# Patient Record
Sex: Female | Born: 1980 | Race: White | Hispanic: No | Marital: Married | Smoking: Never smoker
Health system: Southern US, Community
[De-identification: ages and names within clinical notes are randomized; demographics above are authoritative.]

## PROBLEM LIST (undated history)

## (undated) ENCOUNTER — Inpatient Hospital Stay (HOSPITAL_COMMUNITY): Payer: Self-pay

## (undated) DIAGNOSIS — Z9882 Breast implant status: Secondary | ICD-10-CM

## (undated) DIAGNOSIS — L811 Chloasma: Secondary | ICD-10-CM

## (undated) DIAGNOSIS — T4145XA Adverse effect of unspecified anesthetic, initial encounter: Secondary | ICD-10-CM

## (undated) DIAGNOSIS — N979 Female infertility, unspecified: Secondary | ICD-10-CM

## (undated) DIAGNOSIS — T8859XA Other complications of anesthesia, initial encounter: Secondary | ICD-10-CM

## (undated) HISTORY — DX: Chloasma: L81.1

## (undated) HISTORY — PX: BREAST SURGERY: SHX581

---

## 2004-09-06 ENCOUNTER — Other Ambulatory Visit: Admission: RE | Admit: 2004-09-06 | Discharge: 2004-09-06 | Payer: Self-pay | Admitting: Obstetrics and Gynecology

## 2005-09-12 ENCOUNTER — Other Ambulatory Visit: Admission: RE | Admit: 2005-09-12 | Discharge: 2005-09-12 | Payer: Self-pay | Admitting: Obstetrics and Gynecology

## 2006-10-17 ENCOUNTER — Other Ambulatory Visit: Admission: RE | Admit: 2006-10-17 | Discharge: 2006-10-17 | Payer: Self-pay | Admitting: Obstetrics & Gynecology

## 2006-11-05 DIAGNOSIS — Z9882 Breast implant status: Secondary | ICD-10-CM

## 2006-11-05 HISTORY — DX: Breast implant status: Z98.82

## 2007-11-03 ENCOUNTER — Other Ambulatory Visit: Admission: RE | Admit: 2007-11-03 | Discharge: 2007-11-03 | Payer: Self-pay | Admitting: Obstetrics and Gynecology

## 2008-11-16 ENCOUNTER — Other Ambulatory Visit: Admission: RE | Admit: 2008-11-16 | Discharge: 2008-11-16 | Payer: Self-pay | Admitting: Obstetrics and Gynecology

## 2010-11-05 DIAGNOSIS — L811 Chloasma: Secondary | ICD-10-CM

## 2010-11-05 HISTORY — DX: Chloasma: L81.1

## 2011-12-27 ENCOUNTER — Ambulatory Visit
Admission: RE | Admit: 2011-12-27 | Discharge: 2011-12-27 | Disposition: A | Discharge status: Home / Self Care | Payer: Self-pay | Source: Ambulatory Visit | Attending: Diagnostic Radiology | Admitting: Diagnostic Radiology

## 2011-12-27 ENCOUNTER — Other Ambulatory Visit: Payer: Self-pay | Admitting: Diagnostic Radiology

## 2011-12-27 DIAGNOSIS — S0990XA Unspecified injury of head, initial encounter: Secondary | ICD-10-CM

## 2011-12-27 DIAGNOSIS — R51 Headache: Secondary | ICD-10-CM

## 2013-03-16 ENCOUNTER — Encounter: Payer: Self-pay | Admitting: *Deleted

## 2013-03-17 ENCOUNTER — Encounter: Payer: Self-pay | Admitting: *Deleted

## 2013-03-17 ENCOUNTER — Ambulatory Visit (INDEPENDENT_AMBULATORY_CARE_PROVIDER_SITE_OTHER): Payer: BC Managed Care – PPO | Admitting: Nurse Practitioner

## 2013-03-17 ENCOUNTER — Encounter: Payer: Self-pay | Admitting: Nurse Practitioner

## 2013-03-17 VITALS — BP 108/60 | HR 66 | Resp 12 | Ht 63.5 in | Wt 121.0 lb

## 2013-03-17 DIAGNOSIS — Z0000 Encounter for general adult medical examination without abnormal findings: Secondary | ICD-10-CM

## 2013-03-17 DIAGNOSIS — Z309 Encounter for contraceptive management, unspecified: Secondary | ICD-10-CM

## 2013-03-17 DIAGNOSIS — Z01419 Encounter for gynecological examination (general) (routine) without abnormal findings: Secondary | ICD-10-CM

## 2013-03-17 DIAGNOSIS — IMO0001 Reserved for inherently not codable concepts without codable children: Secondary | ICD-10-CM

## 2013-03-17 LAB — POCT URINALYSIS DIPSTICK
Leukocytes, UA: NEGATIVE
Urobilinogen, UA: NEGATIVE
pH, UA: 6

## 2013-03-17 MED ORDER — DROSPIRENONE-ETHINYL ESTRADIOL 3-0.02 MG PO TABS: 1.0000 | ORAL_TABLET | Freq: Every day | ORAL | Status: DC

## 2013-03-17 NOTE — Patient Instructions (Addendum)

## 2013-03-17 NOTE — Progress Notes (Signed)
32 y.o. G0P0 Single Caucasian Fe here for annual exam.  Engaged to be married 04/18/13. menses last for 4 days, slight cramps.  No PMS symptoms.  Wants to plan a pregnancy right after the honeymoon.  Patient's last menstrual period was 03/02/2013.          Sexually active: yes Same partner 3 years The current method of family planning is OCP (estrogen/progesterone).    Exercising: yes  pilates, zumba and weights Smoker:  no  Health Maintenance: Pap:  02/26/2012  Normal (no history of abnormal pap) MMG:  never TDaP:  Within last 4 years, per pt. (will check records) Gardasil - did not take Labs: pt denied   reports that she has never smoked. She has never used smokeless tobacco. She reports that she drinks about 1.8 ounces of alcohol per week. She reports that she does not use illicit drugs.  Past Medical History  Diagnosis Date  . Melasma 2012    History reviewed. No pertinent past surgical history.  Current Outpatient Prescriptions  Medication Sig Dispense Refill  . drospirenone-ethinyl estradiol (YAZ,GIANVI,LORYNA) 3-0.02 MG tablet Take 1 tablet by mouth daily.      . Multiple Vitamin (MULTIVITAMIN) capsule Take 1 capsule by mouth daily.       No current facility-administered medications for this visit.    History reviewed. No pertinent family history.  ROS:  Pertinent items are noted in HPI.  Otherwise, a comprehensive ROS was negative.  Exam:   BP 108/60  Pulse 66  Resp 12  Ht 5' 3.5" (1.613 m)  Wt 121 lb (54.885 kg)  BMI 21.1 kg/m2  LMP 03/02/2013 Height: 5' 3.5" (161.3 cm)  Ht Readings from Last 3 Encounters:  03/17/13 5' 3.5" (1.613 m)    General appearance: alert, cooperative and appears stated age Head: Normocephalic, without obvious abnormality, atraumatic Neck: no adenopathy, supple, symmetrical, trachea midline and thyroid normal to inspection and palpation Lungs: clear to auscultation bilaterally Breasts: normal appearance, no masses or tenderness,  positive findings: implants bilaterally Heart: regular rate and rhythm Abdomen: soft, non-tender; no masses,  no organomegaly Extremities: extremities normal, atraumatic, no cyanosis or edema Skin: Skin color, texture, turgor normal. No rashes or lesions Lymph nodes: Cervical, supraclavicular, and axillary nodes normal. No abnormal inguinal nodes palpated Neurologic: Grossly normal   Pelvic: External genitalia:  no lesions              Urethra:  normal appearing urethra with no masses, tenderness or lesions              Bartholin's and Skene's: normal                 Vagina: normal appearing vagina with normal color and discharge, no lesions              Cervix: anteverted              Pap taken: no Bimanual Exam:  Uterus:  normal size, contour, position, consistency, mobility, non-tender              Adnexa: no mass, fullness, tenderness               Rectovaginal: Confirms               Anus:  normal sphincter tone, no lesions  A:  Well Woman with normal exam  Contraception with OCP  Wants to plan pregnancy after wedding in June  P:   Pap smear as per guidelines  Return in June after honeymoon for preconceptual planning  Start prenatal multivitamins now  counseled on breast self exam, adequate intake of calcium and vitamin D,   diet and exercise  return annually or prn  An After Visit Summary was printed and given to the patient.

## 2013-03-18 NOTE — Progress Notes (Signed)
Encounter reviewed by Dr. France Lusty Silva.  

## 2013-05-04 ENCOUNTER — Ambulatory Visit: Payer: BC Managed Care – PPO | Admitting: Nurse Practitioner

## 2013-11-20 ENCOUNTER — Telehealth: Payer: Self-pay | Admitting: Nurse Practitioner

## 2013-11-20 NOTE — Telephone Encounter (Signed)
Pt would like to have fertility testing done.

## 2013-11-23 NOTE — Telephone Encounter (Signed)
Patient has been trying to conceive since June 2014. Having regular periods. Appointment with Lauro FranklinPatricia Rolen-Grubb, FNP scheduled for 11/26/13.

## 2013-11-23 NOTE — Telephone Encounter (Signed)
I discussed this with Dr. Edward JollySilva - since she has been trying for almost a year. One of the MD's needs to see her now and evaluate infertility and decide on Clomid, etc.  She should not be on my schedule.

## 2013-11-24 NOTE — Telephone Encounter (Signed)
Spoke with pt about Dr. Rica RecordsSilva's recommendation for her to see an MD about infertility. Pt agreeable. Pt needs appt after 2 pm if possible. Sched OV with SM 11-26-13 at 4:30.

## 2013-11-24 NOTE — Telephone Encounter (Signed)
LMTCB to reschedule appt  

## 2013-11-26 ENCOUNTER — Ambulatory Visit: Payer: BC Managed Care – PPO | Admitting: Nurse Practitioner

## 2013-11-26 ENCOUNTER — Ambulatory Visit (INDEPENDENT_AMBULATORY_CARE_PROVIDER_SITE_OTHER): Payer: BC Managed Care – PPO | Admitting: Obstetrics & Gynecology

## 2013-11-26 VITALS — BP 120/78 | HR 72 | Resp 16 | Ht 63.25 in | Wt 116.2 lb

## 2013-11-26 DIAGNOSIS — Z3169 Encounter for other general counseling and advice on procreation: Secondary | ICD-10-CM

## 2013-11-26 DIAGNOSIS — N915 Oligomenorrhea, unspecified: Secondary | ICD-10-CM

## 2013-11-26 DIAGNOSIS — Z0184 Encounter for antibody response examination: Secondary | ICD-10-CM

## 2013-11-26 MED ORDER — LETROZOLE 2.5 MG PO TABS: ORAL_TABLET | ORAL | Status: DC

## 2013-11-26 NOTE — Progress Notes (Signed)
Patient ID: Tiffany George, female   DOB: Mar 09, 1981, 33 y.o.   MRN: [id number]  32 y.o.  Married Caucasian Female here for preconceptual counseling.  Got married in June.  Was on yaz for years.  Total use for OCPs was about 15 years.  Went off pills, started cycle immediately.  Cycles are between 28-30 with one month 32 days.  OPTs show ovulation consistently 20-21 days.  Flow has been really light but lasts 7-8 days.  Doesn't really have cramping which is the reason she went on pills in the first place.  Gynecological History:   Menarche: LMP:  10/30/13 Length of cycle: 7-8 days Length of Menses: 28-30 days GYN infectious disease history: No abn paps.  No STDs.   PMH: Any history of DM, HTN, epilepsy, Heart Murmur, or thyroid problems? No  Any domestic violence? No Any medications? No     Patient's Past Medical History: Have you ever had surgery related to female organs? No Past pregnancies/ complications/ or miscarriages/ abortions? No ETOH? No Tobacco Use?No Drug use? No  Reviewed Medication list: Yes Current job exposure risk:  No Hot tub/ sauna use? Three times a year, at most (spouse) Do you commonly run long distance or do strenuous exercise? No, pilates and gym 3 times weekly Do you eat a strict vegetarian diet? No  Partners Past Medical History: Have you ever had surgery related to female organs? No Previous Paternity? No Testicular Injury? No ETOH?   No Tobacco use: No Drug use: No Current medications:  No medications Current job exposure risk toxins/ lead/ mercury:  No Hot/ tub sauna use? No Do you commonly run long distance or do strenuous exercise? No  No genetic defects known in either family.  No mental retardation or Down's syndrome.  No twins in either family.   Immunization Updates: Rubella Vaccine/ titer:  Obtained today Chicken Pox / vaccination: Yes Tdap for pt. and partner: Yes, 8/09   Assessment:  Luteal phase defect with ovulation on day 20 or  21  Plan: Continue PNVs  Have spouse do a semen analysis.  "Kit" given.  Order given.  Discussed in great detail how this should be done.  Pt was very confused so instructions written step by step on the order form (which already had instructions on it). Discussion of timing of intercourse in relation to ovulation  Labs: FSH, TSH, Rubella today.  Femara 2.5 mg, 3 tabs days 5-9 of cycle. 1% triplet, 5% twinning rates discussed as well as mood swings, hot flashes, and ovarian cyst formation.  Day 23 progesterone.  Pt to call with onset of cycle for lab order/date.  ~50 minutes spent with patient >50% of time was in face to face discussion of above.

## 2013-11-26 NOTE — Patient Instructions (Signed)
Start with next cycle Femara 2.$RemoveBefore'5mg'[id number]$ .  You will take 3 tablets (7.$RemoveBefo'5mg'qIxCVCoTTjH$ ) days 5-9.  First day of cycle is first real day of bleeding. Take Femara around same time every day.   You can continue the Ovulation predictor kit.  Should ovulate around day 15.   Test progesterone level on day 23 cycle.  That lab will be done either in out office or at a [facility] lab.   Call the office when you start your cycle, so I know when you start the Femara.  Do Semen analysis.

## 2013-11-27 ENCOUNTER — Encounter: Payer: Self-pay | Admitting: Obstetrics & Gynecology

## 2013-11-27 LAB — TSH: TSH: 2.08 u[IU]/mL (ref 0.350–4.500)

## 2013-11-28 LAB — FOLLICLE STIMULATING HORMONE: FSH: 2.2 m[IU]/mL

## 2013-11-28 LAB — RUBELLA SCREEN: Rubella: 1.8 Index — ABNORMAL HIGH (ref <0.90)

## 2013-12-02 ENCOUNTER — Telehealth: Payer: Self-pay | Admitting: Obstetrics & Gynecology

## 2013-12-02 DIAGNOSIS — N915 Oligomenorrhea, unspecified: Secondary | ICD-10-CM

## 2013-12-02 NOTE — Telephone Encounter (Signed)
Just signed off on labs.  Her FSH is fine as is rubella and TSH.

## 2013-12-02 NOTE — Telephone Encounter (Signed)
Dr. Hyacinth MeekerMiller, no result note yet, can you review? I just wanted confirm a message from you prior as I didn't want to interpret [facility].

## 2013-12-02 NOTE — Telephone Encounter (Signed)
Message copied by Joeseph AmorFAST, Mildreth Reek L on Wed Dec 02, 2013  4:42 PM ------      Message from: Jerene BearsMILLER, MARY S      Created: Wed Dec 02, 2013  3:53 PM       Inform TSH, FSH normal.  Rubella is high which means she has antibodies and doesn't need repeat vaccination.  Thank you. ------

## 2013-12-02 NOTE — Telephone Encounter (Signed)
Spoke with patient and message from Dr. Hyacinth MeekerMiller given-verbalized understanding.   Patient states she started her cycle on 1/26, states she will start Femara on 1/30 and will need 23 day progesterone on 2/17. Lab appointment scheduled and patient aware. Progesterone ordered. Dr. Hyacinth MeekerMiller is progesterone order okay or does it need to be the 17-Hydroxyprogesterone?

## 2013-12-02 NOTE — Telephone Encounter (Signed)
Pt also wants to talk to the nurse about her results

## 2013-12-03 NOTE — Telephone Encounter (Signed)
Progesterone only is correct.  Not 17-hydroxyprogesterone.  Encounter closed.

## 2013-12-04 ENCOUNTER — Telehealth: Payer: Self-pay

## 2013-12-04 NOTE — Telephone Encounter (Signed)
Patient is retuning a call to kelly okay to leave vm

## 2013-12-04 NOTE — Telephone Encounter (Signed)
Message copied by Elisha HeadlandNIX, Ramonita Koenig S on Fri Dec 04, 2013  9:09 AM ------      Message from: Jerene BearsMILLER, MARY S      Created: Wed Dec 02, 2013  3:53 PM       Inform TSH, FSH normal.  Rubella is high which means she has antibodies and doesn't need repeat vaccination.  Thank you. ------

## 2013-12-04 NOTE — Telephone Encounter (Signed)
lmtcb

## 2013-12-04 NOTE — Telephone Encounter (Signed)
Patient notified of results. See result note.//kn 

## 2013-12-08 ENCOUNTER — Telehealth: Payer: Self-pay | Admitting: Obstetrics & Gynecology

## 2013-12-08 DIAGNOSIS — IMO0002 Reserved for concepts with insufficient information to code with codable children: Secondary | ICD-10-CM

## 2013-12-08 NOTE — Telephone Encounter (Signed)
Pt's husband Alycia RossettiRyan is calling to see if his sperm analysis results are in yet.

## 2013-12-08 NOTE — Telephone Encounter (Signed)
Call to patient, notified of husband semen analysis and Dr Rondel BatonMiller's recommendation to refer for IUI.  [facility] while waiting for referral.  She is on cycle day 9 now and just completed Femara. Has progesterone level scheduled for 12-22-13. Will refer to Dr April MansonYalcinkaya and phone number given.  Will schedule [facility] with next cycle if has not yet seen Dr April MansonYalcinkaya.     Anything additional?

## 2013-12-08 NOTE — Telephone Encounter (Signed)
Agree with note and plan.  Encounter closed.

## 2013-12-08 NOTE — Telephone Encounter (Signed)
Dr. Hyacinth MeekerMiller, do you have Sperm testing results?

## 2013-12-09 ENCOUNTER — Telehealth: Payer: Self-pay | Admitting: Obstetrics & Gynecology

## 2013-12-09 NOTE — Telephone Encounter (Addendum)
Return call to patient.  Asking with several questions:  1) Requesting specific percentages of abnormal forms in husband's semen analysis.  Percentages given as per report and that no normal forms were identified.  Advised thaat this test will likely be repeated when she sees Dr April MansonYalcinkaya and that Dr Hyacinth MeekerMiller even thought urology consult might be recommended by [facility].    2) Patient asking if she will have IUI at appointment on 12-23-13.  Advised this is consult only and that there are multiple things done with IUI to maximize potential for pregnancy.  Briefly described steps involved with monitoring ovarian function and timing of procedure, [facility]I will review this in detail at consult.  3) If husband has been sick and on antibiotics for three days prior to semen analysis would this have caused these results.  Advised it may have some impact but will need to check with Dr Hyacinth MeekerMiller for specifics and again explained that this test will likely be repeated.    Please advise.

## 2013-12-09 NOTE — Telephone Encounter (Signed)
Patient has some questions about results. Please call before 3:45

## 2013-12-09 NOTE — Addendum Note (Signed)
Addended by: Joeseph AmorFAST, Mechille Varghese L on: 12/09/2013 02:33 PM   Modules accepted: Orders

## 2013-12-10 NOTE — Telephone Encounter (Signed)
Call to patient and advised of Dr Rondel BatonMiller's response. Patient has received call and appt scheduled with [facility]I for 12-17-13.  She is scheduled for day 23 prog on 12-22-13. Advised to let us know if there is any change in plans once she seens [facility].  Should have level done either here or at their office, just update us after appointment.  Routing to provider for final review. Patient agreeable to disposition. Will close encounter

## 2013-12-10 NOTE — Telephone Encounter (Signed)
Patient returning Sally's call. °

## 2013-12-10 NOTE — Telephone Encounter (Signed)
Call back to patient to advise of Dr Hyacinth MeekerMiller response, LMTCB.

## 2013-12-10 NOTE — Telephone Encounter (Signed)
It is possible that the antibiotics could make that change.  Dr. Celene KrasYalcikaya always repeats the semen analysis when they look like this to be 100% sure.  But if that is the case, IUIs are definitely going to be needed, if not more and they will need to be with specialist.

## 2013-12-21 ENCOUNTER — Other Ambulatory Visit (INDEPENDENT_AMBULATORY_CARE_PROVIDER_SITE_OTHER): Payer: BC Managed Care – PPO

## 2013-12-21 DIAGNOSIS — N915 Oligomenorrhea, unspecified: Secondary | ICD-10-CM

## 2013-12-22 ENCOUNTER — Other Ambulatory Visit: Payer: BC Managed Care – PPO

## 2013-12-22 ENCOUNTER — Telehealth: Payer: Self-pay | Admitting: *Deleted

## 2013-12-22 LAB — PROGESTERONE: Progesterone: 20.5 ng/mL

## 2013-12-22 NOTE — Telephone Encounter (Signed)
Call to patient, VM has name confirmation, LM calling with good results and call back tomorrow.

## 2013-12-22 NOTE — Telephone Encounter (Signed)
Message copied by Alisa GraffYEAKLEY, SARAH on Tue Dec 22, 2013  3:58 PM ------      Message from: Jerene BearsMILLER, MARY S      Created: Tue Dec 22, 2013  3:48 PM       Inform progesterone level indicates she ovulated.  This is good.  Does she have appt yet with specialist.  Husband with abnormal semen analysis. ------

## 2013-12-23 NOTE — Telephone Encounter (Signed)
Spoke with patient and message from Dr. Hyacinth MeekerMiller given. Verbalized understanding.   She states she saw Dr. April MansonYalcinkaya on 2/12. Will start another rx for Femara, do another progesterone test, u/s and then if next will be IUI.   Advised would route that information to Dr. Hyacinth MeekerMiller.   Routing to provider for final review. Patient agreeable to disposition. Will close encounter

## 2014-01-01 ENCOUNTER — Telehealth: Payer: Self-pay | Admitting: *Deleted

## 2014-01-01 ENCOUNTER — Telehealth: Payer: Self-pay | Admitting: Obstetrics & Gynecology

## 2014-01-01 NOTE — Telephone Encounter (Signed)
Patient states she had a missed period this month. LMP 11/30/13 with positive home pregnancy test. Denies complaints. Scheduled office visit with Dr. Hyacinth MeekerMiller for confirmation. Patient agreeable.  Routing to provider for final review. Patient agreeable to disposition. Will close encounter

## 2014-01-01 NOTE — Telephone Encounter (Signed)
Message copied by Alisa GraffYEAKLEY, SARAH on Fri Jan 01, 2014  3:13 PM ------      Message from: Jerene BearsMILLER, MARY S      Created: Tue Dec 22, 2013  3:48 PM       Inform progesterone level indicates she ovulated.  This is good.  Does she have appt yet with specialist.  Husband with abnormal semen analysis. ------

## 2014-01-01 NOTE — Telephone Encounter (Signed)
Advised of lab results.  Positive for ovulation.  Patient reports she now has positive home UPT!  LPM 11-30-13. First cycle on Femara.  No bleeding and no other symptoms.    Had appointment on 12-17-13 with Dr April MansonYalcinkaya and was given another RX for Femara and was told "she may get pregnant on her own".    [facility] spoke with patient earlier and has scheduled OV for Monday 01-04-14 with Dr Hyacinth MeekerMiller.  Anything else?

## 2014-01-01 NOTE — Telephone Encounter (Signed)
Patient says her pregnancy was positive. Patient would like to talk to Dr.Miller's nurse.

## 2014-01-04 ENCOUNTER — Ambulatory Visit (INDEPENDENT_AMBULATORY_CARE_PROVIDER_SITE_OTHER): Payer: BC Managed Care – PPO | Admitting: Obstetrics & Gynecology

## 2014-01-04 VITALS — BP 120/62 | HR 64 | Resp 16 | Ht 63.25 in | Wt 116.0 lb

## 2014-01-04 DIAGNOSIS — Z3201 Encounter for pregnancy test, result positive: Secondary | ICD-10-CM

## 2014-01-04 DIAGNOSIS — N912 Amenorrhea, unspecified: Secondary | ICD-10-CM

## 2014-01-04 LAB — POCT URINE PREGNANCY: PREG TEST UR: POSITIVE

## 2014-01-04 NOTE — Progress Notes (Signed)
Pelvic U/S scheduled and patient aware/agreeable to time on 01/19/14 at 1600.  Patient verbalized understanding of the U/S appointment cancellation policy. Advised will need to cancel within 72 business hours (3 business days) or will have $100.00 no show fee placed to account.

## 2014-01-04 NOTE — Progress Notes (Signed)
33 yo G1P0 MWF here for confirmation of pregnancy.  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  She was started on Femara Femara 2.5mg  days 5-9.  Semen analysis was abnormal and she was referred to Dr. April MansonYalcinkaya who advised she try for several months before proceeding with any additional treatments.  She used Clomid at the beginning of February and missed her menstrual cycle.  LMP 11/30/13.  She has done several home UPTs and does have a +UPT here in the office today.  EGA today is 5 0/7 weeks.  She is having breast tenderness but denies fatigue and nausea at this time.  Advised it is a little early for nausea so this may still occur.  She is taking a PNV at this time.  Her spouse is not with her today.   No cats in the home.  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  She is advised about CMV and need for good handwashing.  Tdap 8/09.  Had chicken pox as a child.  Aware flu vaccine safe and recommended.  Rubella was tested and she is immune.  This was done 11/27/13.    Fish/shellfish/mercury discuss.  Patient does not eat much fish.  Unpasteurized cheese/juices discussed.  Nitrites in foods disucssed.  Exercise and intercourse discussed.  Assessment:  Newly + UPT Plan:  Continue PNV.  Return for viability u/s in two weeks.  Appt will be made.  ~15 minutes spent with patient >50% of time was in face to face discussion of above.

## 2014-01-04 NOTE — Telephone Encounter (Signed)
No.  Ultrasound scheduled.  Encounter closed.

## 2014-01-05 ENCOUNTER — Encounter: Payer: Self-pay | Admitting: Obstetrics & Gynecology

## 2014-01-05 NOTE — Patient Instructions (Signed)
Please call if you have any pain or bleeding before your ultrasound appt.

## 2014-01-18 ENCOUNTER — Telehealth: Payer: Self-pay | Admitting: Obstetrics & Gynecology

## 2014-01-18 NOTE — Telephone Encounter (Signed)
Spoke with pt who is wondering if it is OK to have her nails done, as she has read about fumes not being safe.  Advised that I think it would be OK, but she has PUS tomorrow with Dr. Hyacinth MeekerMiller and can confirm then.

## 2014-01-18 NOTE — Telephone Encounter (Signed)
Patient is calling she has a ultrasound tomorrow to confirm pregnancy and she wants to know if it is okay to go to a nail salon. She is very concerned from what she has read online and wants to talk to a nurse.

## 2014-01-18 NOTE — Telephone Encounter (Signed)
Agree.  Ok to close encounter.

## 2014-01-19 ENCOUNTER — Ambulatory Visit (INDEPENDENT_AMBULATORY_CARE_PROVIDER_SITE_OTHER): Payer: BC Managed Care – PPO

## 2014-01-19 ENCOUNTER — Other Ambulatory Visit: Payer: Self-pay | Admitting: Obstetrics & Gynecology

## 2014-01-19 ENCOUNTER — Ambulatory Visit (INDEPENDENT_AMBULATORY_CARE_PROVIDER_SITE_OTHER): Payer: BC Managed Care – PPO | Admitting: Obstetrics & Gynecology

## 2014-01-19 VITALS — BP 104/68 | Ht 63.25 in | Wt 120.2 lb

## 2014-01-19 DIAGNOSIS — N912 Amenorrhea, unspecified: Secondary | ICD-10-CM

## 2014-01-19 DIAGNOSIS — O3680X0 Pregnancy with inconclusive fetal viability, not applicable or unspecified: Secondary | ICD-10-CM

## 2014-01-19 LAB — US OB TRANSVAGINAL

## 2014-01-19 NOTE — Progress Notes (Signed)
33 y.o. Tiffany KandG1 Marriedfemale here for a pelvic ultrasound for viability.  Pt and spouse have been trying to get pregnant since marriage.  Pt started on Femara and conceived in first month.  Doing well.  Having fatigue and breast tenderness.  No cramping or vaginal bleeding.   Patient's last menstrual period was 11/30/2013.  FINDINGS: UTERUS: Two sacs seen.  One appears normal with normal GS, YS, and fetal pole with +FCA.  Second is small and slightly irregular with no evidence of yolk sac or fetal pole.  Size consistent with dating. ADNEXA:   Left ovary 3.7cm, no evidence of abnormalities   Right ovary 3.7cm, no evidence of abnormalities CUL DE SAC: no free fluid   D/w pt findings including second sac--possible early but nonviable sac.  Advised she will either pass this or will resolve spontaneously.    No cats in the home.  CMV risk low as not around young children.   Tdap 07/05/08.  Had chicken pox.  Aware flu vaccine safe and recommended.  Rubella Immune from testing 1/15.  Fish/shellfish/mercury discuss.  Patient does not eat fish.  Unpasteurized cheese/juices discussed.  Nitrites in foods disucssed.  Exercise and intercourse discussed.  Fetal DNA particle testing discussed.  First trimester down's testing discussed.  Cystic fibrosis discussed.   Assessment:  Viable singleton IUD with smaller, separate 5 week sized sac with no evidence of fetal tissue Plan: Transfer of care.  Reviewed OB offices with pt. Reassured about second sac.  Pt knows this will not result in twin pregnancy.  Very happy.  All questions answered.

## 2014-01-25 ENCOUNTER — Telehealth: Payer: Self-pay | Admitting: Obstetrics & Gynecology

## 2014-01-25 DIAGNOSIS — O209 Hemorrhage in early pregnancy, unspecified: Secondary | ICD-10-CM

## 2014-01-25 NOTE — Telephone Encounter (Signed)
Spoke with pt who is pregnant and had viability PUS last week. [place] She had some bleeding into her underwear, about a half dollar sized amount that did not soak through to her clothes. No pad needed. On Saturday she had some cramps during the day and another episode of bleeding that was a little less than Friday. Overnight on Sat she had no bleeding, but she did notice a weird "clear gel" type substance in her underwear. Nothing happened Sunday or today so far. Pt wondered if it could've been the "extra sac coming out." Please advise.

## 2014-01-25 NOTE — Telephone Encounter (Signed)
Per SM, pt can come tomorrow at lunchtime for a PUS. Slot available at noon. Pt notified and agreeable.

## 2014-01-25 NOTE — Telephone Encounter (Signed)
Spoke with patient. Patient wanting earlier US appointment for tomorrow. Advised she was given the earliest appointment for US. Patient agreeable and verbalizes understanding.  Routing to provider for final review. Patient agreeable to disposition. Will close encounter

## 2014-01-25 NOTE — Telephone Encounter (Signed)
Patient has a question for Dr. Rondel BatonMiller's nurse. No details given. Patient says this is urgent.

## 2014-01-25 NOTE — Telephone Encounter (Signed)
PUS scheduled and order entered.  Routing to provider for final review. Patient agreeable to disposition. Will close encounter

## 2014-01-26 ENCOUNTER — Ambulatory Visit (INDEPENDENT_AMBULATORY_CARE_PROVIDER_SITE_OTHER): Payer: BC Managed Care – PPO | Admitting: Gynecology

## 2014-01-26 ENCOUNTER — Encounter: Payer: Self-pay | Admitting: Gynecology

## 2014-01-26 ENCOUNTER — Other Ambulatory Visit (INDEPENDENT_AMBULATORY_CARE_PROVIDER_SITE_OTHER): Payer: BC Managed Care – PPO

## 2014-01-26 ENCOUNTER — Other Ambulatory Visit: Payer: Self-pay | Admitting: *Deleted

## 2014-01-26 VITALS — BP 104/70 | Ht 63.25 in | Wt 119.0 lb

## 2014-01-26 DIAGNOSIS — O209 Hemorrhage in early pregnancy, unspecified: Secondary | ICD-10-CM

## 2014-01-26 LAB — US OB TRANSVAGINAL

## 2014-01-26 NOTE — Progress Notes (Signed)
      Pt here for f/u viability u/s.  Se conceived with femara and noted to have twins with second pregnancy nonviable.  Pt has single episode of bleeding and cramping but none since.   PUS reviewed today and compared with last week u/s.  SIUP noted, normal appearing yolk sac, and amnion noted.  FHR normal 155.  Second sac without discernable fetus. No evidence of subchorionic hemorrhage. Pt pleased. She was given handout of OB's Questions addressed 6762m spent discussing early pregnancy, >50% face to face

## 2014-01-28 ENCOUNTER — Encounter: Payer: Self-pay | Admitting: Obstetrics & Gynecology

## 2014-02-08 ENCOUNTER — Telehealth: Payer: Self-pay | Admitting: Obstetrics & Gynecology

## 2014-02-08 ENCOUNTER — Ambulatory Visit (INDEPENDENT_AMBULATORY_CARE_PROVIDER_SITE_OTHER): Payer: BC Managed Care – PPO | Admitting: Obstetrics & Gynecology

## 2014-02-08 ENCOUNTER — Encounter: Payer: Self-pay | Admitting: Obstetrics & Gynecology

## 2014-02-08 VITALS — BP 116/64 | HR 68 | Resp 20 | Ht 63.25 in | Wt 120.2 lb

## 2014-02-08 DIAGNOSIS — O209 Hemorrhage in early pregnancy, unspecified: Secondary | ICD-10-CM | POA: Insufficient documentation

## 2014-02-08 NOTE — Telephone Encounter (Signed)
Pt says she was spotting over weekend with her early pregnancy.

## 2014-02-08 NOTE — Progress Notes (Signed)
Subjective:     Patient ID: Tiffany ReadingKelli L Cooperwood, female   DOB: January 31, 1981, 33 y.o.   MRN: [id number]  HPI 33 yo G1 MWF with first trimester bleeding that has occurred now twice after doing Pilates.  Had pink spotting last Thursday.  This was not heavy.  There is no cramping.  Nausea is stable.  No back pain.  Just anxious.  Is seeing Dr. Vincente PoliGrewal later in the week but really doesn't want to wait for that appt to make sure everything is okay  Review of Systems  All other systems reviewed and are negative.       Objective:   Physical Exam  Constitutional: She is oriented to person, place, and time. She appears well-developed and well-nourished.  Abdominal: Soft. Bowel sounds are normal.  Genitourinary: There is no rash or tenderness on the right labia. There is no rash or tenderness on the left labia. No bleeding around the vagina. No vaginal discharge found.  Uterus enlarged at 8-10 weeks sized.  Dark, mucousy blood at cervical os.  Non tender exam.  Neurological: She is alert and oriented to person, place, and time.  Skin: Skin is warm and dry.  Psychiatric: She has a normal mood and affect.       Assessment:     First trimester bleeding     Plan:     Return for PUS

## 2014-02-08 NOTE — Telephone Encounter (Signed)
Patient states that she has intermittent spotting that is brown. She has appointment with an OB on 02/10/14. Not currently seen by them. She is intermittently tearful on the phone. No abdominal pain. Offered office visit with Dr. Hyacinth MeekerMiller, can come now. Patient requested 1030 appointment, scheduled for 1045.   Routing to provider for final review. Patient agreeable to disposition. Will close encounter

## 2014-02-09 ENCOUNTER — Ambulatory Visit (INDEPENDENT_AMBULATORY_CARE_PROVIDER_SITE_OTHER): Payer: BC Managed Care – PPO | Admitting: Obstetrics & Gynecology

## 2014-02-09 ENCOUNTER — Ambulatory Visit (INDEPENDENT_AMBULATORY_CARE_PROVIDER_SITE_OTHER): Payer: BC Managed Care – PPO

## 2014-02-09 ENCOUNTER — Other Ambulatory Visit: Payer: Self-pay | Admitting: Obstetrics & Gynecology

## 2014-02-09 DIAGNOSIS — O209 Hemorrhage in early pregnancy, unspecified: Secondary | ICD-10-CM

## 2014-02-09 DIAGNOSIS — O021 Missed abortion: Secondary | ICD-10-CM

## 2014-02-09 LAB — US OB TRANSVAGINAL

## 2014-02-09 NOTE — Progress Notes (Signed)
33 yo G1 MWF here for repeat viability scan.  Pt has continued to have spotting and has new OB appt this week.  Very anxious about this.  First u/s showed two sacs--one was empty.  I have felt spotting was due to this.  No pain or cramping.  Nausea has improved.  U/S showed CRL c/w 9 1/7 days where fetus should be 10 1/7 days.  No FCA noted.  Pt obviously upset.  No one is with her today.  D/W pt options of conservative management vs suction D&E.  With the first option, risks of bleeding and pain, possible emergency need for intervention, anemia discussed.  With second option, risk fo uterine perforation, possible retained part of pregnancy and need for future procedure, scarring/Ashermann's syndrome, bleeding, pain discussed.  Pt not sure what she wants to due.  Also discussed possible testing for miscarriage but feel that is something she, spouse, and I should discuss together.  All qeustions answered.  She is going to talk with husband and let me know what she wants to do.  Assessment:  Missed abortion  Plan:  Conservative management vs suction D&E discussed  ~20 minutes spent with patient >50% of time was in face to face discussion of above.

## 2014-02-10 ENCOUNTER — Telehealth: Payer: Self-pay | Admitting: Obstetrics & Gynecology

## 2014-02-10 ENCOUNTER — Encounter: Payer: Self-pay | Admitting: Obstetrics & Gynecology

## 2014-02-10 ENCOUNTER — Telehealth: Payer: Self-pay | Admitting: *Deleted

## 2014-02-10 ENCOUNTER — Other Ambulatory Visit: Payer: Self-pay | Admitting: Obstetrics & Gynecology

## 2014-02-10 MED ORDER — DEXTROSE 5 % IV SOLN
2.0000 g | INTRAVENOUS | Status: DC
  Filled 2014-02-10: qty 2

## 2014-02-10 NOTE — Progress Notes (Unsigned)
Very nice 33 yo MWF here for repeat viability scan due to continued first trimester spotting.  Has new OB appt later this week but before transfer just wanted to make sure everything looks normal.   Does have a lot of anxiety about the pregnancy.  U/S showed CRL to be at 9 1/7 week and should be 10 1/7 week.  There is no evidence of FCA.  Discussed this with pt who is obviously upset.   Options for treatment include conservative management with miscarriage on her own.  Bleeding, cramping discussed. Risks of significant bleeding, need to go to ER emergently discussed.  Benefits of natural process and no procedures discussed.  Suction D&E discussed.  Risks of perforation, bleeding, possible future procedures if all tissue isn't removed, pain, transfusion risk all discussed.  Pt and I also discussed risks of this occuring again and possible testing we can do once miscarriage or D&E is complete.  Pt would like to go and disucss with spouse.  Will let me know decision later today or tomorrow.  Offered repeat PUS if needed.  Pt will consider.  Assessment:  Missed abortion  Plan:  Conservative management vs suction D&E  ~20 minutes spent with patient >50% of time was in face to face discussion of above.

## 2014-02-10 NOTE — Telephone Encounter (Signed)
Call to patient to check on status.  Patient states she ha already talked to Dr Hyacinth MeekerMiller and has been scheduled for tomm at 1200. Surgical instructions reviewed.  Post op scheduled for 02-25-14. Call prn  Routing to provider for final review. Patient agreeable to disposition. Will close encounter

## 2014-02-10 NOTE — Telephone Encounter (Signed)
Calling to check on patient and see if she has any questions or has made any decisions.  I did call the OR this morning and pt is scheduled for D&E tomorrow at 12:00.  Pt did not answer.  Left detailed message.  Pt called me right back.  She has decided to proceed with D&E and would like to proceed tomorrow.  Risks of infection, bleeding, rare risk of needing a transfusion, Uterine perforation with possible risks of bowel/bladder/ureteral/vascular injury, retained products with need for future procedure, and risks of scarring/ashermann's syndrome all discussed.  All questions answered.    Pt knows npo with food for 8 hours and with clear liquids for 1 hours before procedure.  Orders entered.

## 2014-02-10 NOTE — Telephone Encounter (Signed)
Spoke with patient about her liability for surgery tomorrow. Patient responsibility $712.47. Patient paid.

## 2014-02-11 ENCOUNTER — Encounter (HOSPITAL_COMMUNITY): Payer: BC Managed Care – PPO | Admitting: Anesthesiology

## 2014-02-11 ENCOUNTER — Ambulatory Visit (HOSPITAL_COMMUNITY)
Admission: RE | Admit: 2014-02-11 | Discharge: 2014-02-11 | Disposition: A | Discharge status: Home / Self Care | Payer: BC Managed Care – PPO | Source: Ambulatory Visit | Attending: Obstetrics & Gynecology | Admitting: Obstetrics & Gynecology

## 2014-02-11 ENCOUNTER — Ambulatory Visit (HOSPITAL_COMMUNITY): Payer: BC Managed Care – PPO | Admitting: Anesthesiology

## 2014-02-11 ENCOUNTER — Encounter (HOSPITAL_COMMUNITY)
Admission: RE | Disposition: A | Discharge status: Home / Self Care | Payer: Self-pay | Source: Ambulatory Visit | Attending: Obstetrics & Gynecology

## 2014-02-11 ENCOUNTER — Encounter (HOSPITAL_COMMUNITY): Payer: Self-pay

## 2014-02-11 DIAGNOSIS — O021 Missed abortion: Secondary | ICD-10-CM

## 2014-02-11 DIAGNOSIS — O209 Hemorrhage in early pregnancy, unspecified: Secondary | ICD-10-CM

## 2014-02-11 DIAGNOSIS — R110 Nausea: Secondary | ICD-10-CM | POA: Insufficient documentation

## 2014-02-11 HISTORY — PX: DILATION AND EVACUATION: SHX1459

## 2014-02-11 HISTORY — DX: Adverse effect of unspecified anesthetic, initial encounter: T41.45XA

## 2014-02-11 HISTORY — DX: Other complications of anesthesia, initial encounter: T88.59XA

## 2014-02-11 LAB — CBC
HEMATOCRIT: 40.1 % (ref 36.0–46.0)
Hemoglobin: 13.7 g/dL (ref 12.0–15.0)
MCH: 31.2 pg (ref 26.0–34.0)
MCHC: 34.2 g/dL (ref 30.0–36.0)
MCV: 91.3 fL (ref 78.0–100.0)
Platelets: 306 10*3/uL (ref 150–400)
RBC: 4.39 MIL/uL (ref 3.87–5.11)
RDW: 13 % (ref 11.5–15.5)
WBC: 8.1 10*3/uL (ref 4.0–10.5)

## 2014-02-11 LAB — ABO/RH: ABO/RH(D): O POS

## 2014-02-11 SURGERY — DILATION AND EVACUATION, UTERUS
Anesthesia: Monitor Anesthesia Care | Site: Uterus

## 2014-02-11 MED ORDER — KETOROLAC TROMETHAMINE 30 MG/ML IJ SOLN
INTRAMUSCULAR | Status: DC | PRN
  Administered 2014-02-11: 30 mg via INTRAVENOUS

## 2014-02-11 MED ORDER — DEXAMETHASONE SODIUM PHOSPHATE 10 MG/ML IJ SOLN
INTRAMUSCULAR | Status: AC
  Filled 2014-02-11: qty 1

## 2014-02-11 MED ORDER — FENTANYL CITRATE 0.05 MG/ML IJ SOLN
INTRAMUSCULAR | Status: DC | PRN
  Administered 2014-02-11: 50 ug via INTRAVENOUS
  Administered 2014-02-11: 100 ug via INTRAVENOUS
  Administered 2014-02-11: 50 ug via INTRAVENOUS

## 2014-02-11 MED ORDER — ONDANSETRON HCL 4 MG/2ML IJ SOLN
INTRAMUSCULAR | Status: AC
  Filled 2014-02-11: qty 2

## 2014-02-11 MED ORDER — PROPOFOL 10 MG/ML IV EMUL
INTRAVENOUS | Status: AC
  Filled 2014-02-11: qty 20

## 2014-02-11 MED ORDER — LACTATED RINGERS IV SOLN
INTRAVENOUS | Status: DC
  Administered 2014-02-11: 11:00:00 via INTRAVENOUS

## 2014-02-11 MED ORDER — PROMETHAZINE HCL 25 MG/ML IJ SOLN
INTRAMUSCULAR | Status: AC
  Filled 2014-02-11: qty 1

## 2014-02-11 MED ORDER — LIDOCAINE-EPINEPHRINE 1 %-1:100000 IJ SOLN
INTRAMUSCULAR | Status: AC
Start: 2014-02-11 — End: 2014-02-11
  Filled 2014-02-11: qty 1

## 2014-02-11 MED ORDER — LIDOCAINE HCL (CARDIAC) 20 MG/ML IV SOLN
INTRAVENOUS | Status: AC
  Filled 2014-02-11: qty 5

## 2014-02-11 MED ORDER — FENTANYL CITRATE 0.05 MG/ML IJ SOLN
INTRAMUSCULAR | Status: AC
  Filled 2014-02-11: qty 2

## 2014-02-11 MED ORDER — ACETAMINOPHEN 325 MG PO TABS
ORAL_TABLET | ORAL | Status: AC
  Filled 2014-02-11: qty 2

## 2014-02-11 MED ORDER — FENTANYL CITRATE 0.05 MG/ML IJ SOLN
25.0000 ug | INTRAMUSCULAR | Status: DC | PRN
  Administered 2014-02-11: 25 ug via INTRAVENOUS

## 2014-02-11 MED ORDER — DEXAMETHASONE SODIUM PHOSPHATE 10 MG/ML IJ SOLN
INTRAMUSCULAR | Status: DC | PRN
  Administered 2014-02-11: 10 mg via INTRAVENOUS

## 2014-02-11 MED ORDER — ACETAMINOPHEN 325 MG PO TABS
650.0000 mg | ORAL_TABLET | Freq: Once | ORAL | Status: AC
  Administered 2014-02-11: 650 mg via ORAL

## 2014-02-11 MED ORDER — DEXTROSE 5 % IV SOLN
1.0000 g | INTRAVENOUS | Status: DC | PRN
  Administered 2014-02-11: 2 g via INTRAVENOUS

## 2014-02-11 MED ORDER — OXYCODONE-ACETAMINOPHEN 5-325 MG PO TABS: 1.0000 | ORAL_TABLET | ORAL | Status: DC | PRN

## 2014-02-11 MED ORDER — PROPOFOL 10 MG/ML IV BOLUS
INTRAVENOUS | Status: DC | PRN
  Administered 2014-02-11 (×2): 30 mg via INTRAVENOUS

## 2014-02-11 MED ORDER — PROMETHAZINE HCL 6.25 MG/5ML PO SYRP: 6.2500 mg | ORAL_SOLUTION | ORAL | Status: DC | PRN

## 2014-02-11 MED ORDER — PROMETHAZINE HCL 25 MG/ML IJ SOLN
6.2500 mg | INTRAMUSCULAR | Status: DC | PRN
  Administered 2014-02-11: 6.25 mg via INTRAVENOUS

## 2014-02-11 MED ORDER — ONDANSETRON HCL 4 MG/2ML IJ SOLN
INTRAMUSCULAR | Status: DC | PRN
  Administered 2014-02-11: 4 mg via INTRAVENOUS

## 2014-02-11 MED ORDER — HYDROCODONE-ACETAMINOPHEN 5-300 MG PO TABS: 1.0000 | ORAL_TABLET | Freq: Four times a day (QID) | ORAL | Status: DC

## 2014-02-11 MED ORDER — MIDAZOLAM HCL 2 MG/2ML IJ SOLN
INTRAMUSCULAR | Status: AC
Start: 2014-02-11 — End: 2014-02-11
  Filled 2014-02-11: qty 2

## 2014-02-11 MED ORDER — MIDAZOLAM HCL 2 MG/2ML IJ SOLN
INTRAMUSCULAR | Status: DC | PRN
  Administered 2014-02-11: 2 mg via INTRAVENOUS

## 2014-02-11 MED ORDER — LIDOCAINE HCL (CARDIAC) 20 MG/ML IV SOLN
INTRAVENOUS | Status: DC | PRN
  Administered 2014-02-11: 60 mg via INTRAVENOUS

## 2014-02-11 MED ORDER — LIDOCAINE-EPINEPHRINE 1 %-1:100000 IJ SOLN
INTRAMUSCULAR | Status: DC | PRN
  Administered 2014-02-11: 10 mL

## 2014-02-11 MED ORDER — PROPOFOL INFUSION 10 MG/ML OPTIME
INTRAVENOUS | Status: DC | PRN
  Administered 2014-02-11: 120 ug/kg/min via INTRAVENOUS

## 2014-02-11 SURGICAL SUPPLY — 21 items
CATH ROBINSON RED A/P 16FR (CATHETERS) ×3 IMPLANT
CLOTH BEACON ORANGE TIMEOUT ST (SAFETY) ×3 IMPLANT
DECANTER SPIKE VIAL GLASS SM (MISCELLANEOUS) ×3 IMPLANT
GLOVE BIOGEL PI IND STRL 7.0 (GLOVE) ×1 IMPLANT
GLOVE BIOGEL PI INDICATOR 7.0 (GLOVE) ×2
GLOVE ECLIPSE 6.5 STRL STRAW (GLOVE) ×6 IMPLANT
GOWN STRL REUS W/TWL LRG LVL3 (GOWN DISPOSABLE) ×6 IMPLANT
KIT BERKELEY 1ST TRIMESTER 3/8 (MISCELLANEOUS) ×3 IMPLANT
NDL SPNL 22GX3.5 QUINCKE BK (NEEDLE) ×1 IMPLANT
NEEDLE SPNL 22GX3.5 QUINCKE BK (NEEDLE) ×3 IMPLANT
NS IRRIG 1000ML POUR BTL (IV SOLUTION) ×3 IMPLANT
PACK VAGINAL MINOR WOMEN LF (CUSTOM PROCEDURE TRAY) ×3 IMPLANT
PAD OB MATERNITY 4.3X12.25 (PERSONAL CARE ITEMS) ×3 IMPLANT
PAD PREP 24X48 CUFFED NSTRL (MISCELLANEOUS) ×3 IMPLANT
SET BERKELEY SUCTION TUBING (SUCTIONS) ×3 IMPLANT
SYR CONTROL 10ML LL (SYRINGE) ×3 IMPLANT
TOWEL OR 17X24 6PK STRL BLUE (TOWEL DISPOSABLE) ×6 IMPLANT
VACURETTE 10 RIGID CVD (CANNULA) IMPLANT
VACURETTE 7MM CVD STRL WRAP (CANNULA) IMPLANT
VACURETTE 8 RIGID CVD (CANNULA) IMPLANT
VACURETTE 9 RIGID CVD (CANNULA) ×2 IMPLANT

## 2014-02-11 NOTE — Anesthesia Postprocedure Evaluation (Signed)
  Anesthesia Post-op Note  Patient: Chauncey ReadingKelli L Savin  Procedure(s) Performed: Procedure(s): DILATATION AND EVACUATION (N/A) Patient is awake and responsive. Pain and nausea are reasonably well controlled. Vital signs are stable and clinically acceptable. Oxygen saturation is clinically acceptable. There are no apparent anesthetic complications at this time. Patient is ready for discharge.

## 2014-02-11 NOTE — Anesthesia Preprocedure Evaluation (Signed)
Anesthesia Evaluation  Patient identified by MRN, date of birth, ID band Patient awake    Reviewed: Allergy & Precautions, H&P , Patient's Chart, lab work & pertinent test results, reviewed documented beta blocker date and time   Airway Mallampati: II TM Distance: >3 FB Neck ROM: full    Dental no notable dental hx.    Pulmonary  breath sounds clear to auscultation  Pulmonary exam normal       Cardiovascular Rhythm:regular Rate:Normal     Neuro/Psych    GI/Hepatic   Endo/Other    Renal/GU      Musculoskeletal   Abdominal   Peds  Hematology   Anesthesia Other Findings   Reproductive/Obstetrics                           Anesthesia Physical Anesthesia Plan  ASA: II  Anesthesia Plan: MAC   Post-op Pain Management:    Induction: Intravenous  Airway Management Planned: LMA, Mask and Natural Airway  Additional Equipment:   Intra-op Plan:   Post-operative Plan:   Informed Consent: I have reviewed the patients History and Physical, chart, labs and discussed the procedure including the risks, benefits and alternatives for the proposed anesthesia with the patient or authorized representative who has indicated his/her understanding and acceptance.   Dental Advisory Given  Plan Discussed with: CRNA and Surgeon  Anesthesia Plan Comments: (Discussed sedation and potential to need to place airway or ETT if warranted by clinical changes intra-operatively. We will start procedure as MAC.)        Anesthesia Quick Evaluation  

## 2014-02-11 NOTE — Discharge Instructions (Signed)
Post-surgical Instructions, Outpatient Surgery  You may expect to feel dizzy, weak, and drowsy for as long as 24 hours after receiving the medicine that made you sleep (anesthetic). For the first 24 hours after your surgery:    Do not drive a car, ride a bicycle, participate in physical activities, or take public transportation until you are done taking narcotic pain medicines or as directed by Dr. Hyacinth MeekerMiller.   Do not drink alcohol or take tranquilizers.   Do not take medicine that has not been prescribed by your physicians.   Do not sign important papers or make important decisions while on narcotic pain medicines.   Have a responsible person with you.   PAIN MANAGEMENT  Motrin 800mg .  (This is the same as 4-200mg  over the counter tablets of Motrin or ibuprofen.)  You may take this every eight hours or as needed for cramping.    Percocet 5/325mg .  For more severe pain, take one or two tablets every four to six hours as needed for pain control.  (Remember that narcotic pain medications increase your risk of constipation.  If this becomes a problem, you may take an over the counter stool softener like Colace 100mg  up to four times a day.)  DO'S AND DON'T'S  Do not take a tub bath for one week.  You may shower on the first day after your surgery  Do not do any heavy lifting for one to two weeks.  This increases the chance of bleeding.  Do move around as you feel able.  Stairs are fine.  You may begin to exercise again as you feel able.  Do not lift any weights for two weeks.  Do not put anything in the vagina for two weeks--no tampons, intercourse, or douching.    REGULAR MEDIATIONS/VITAMINS:  You may restart all of your regular medications as prescribed.  You may restart all of your vitamins as you normally take them.    PLEASE CALL OR SEEK MEDICAL CARE IF:  You have persistent nausea and vomiting.   You have trouble eating or drinking.   You have an oral temperature above  100.5.   You have constipation that is not helped by adjusting diet or increasing fluid intake. Pain medicines are a common cause of constipation.   You have heavy vaginal bleeding  You have redness or drainage from your incision(s) or there is increasing pain or tenderness near or in the surgical site.

## 2014-02-11 NOTE — Addendum Note (Signed)
Addendum created 02/11/14 1456 by Corky Soxhris Yoshito Gaza, MD   Modules edited: Orders

## 2014-02-11 NOTE — Transfer of Care (Signed)
Immediate Anesthesia Transfer of Care Note  Patient: Tiffany George  Procedure(s) Performed: Procedure(s): DILATATION AND EVACUATION (N/A)  Patient Location: PACU  Anesthesia Type:MAC  Level of Consciousness: awake, alert  and oriented  Airway & Oxygen Therapy: Patient Spontanous Breathing and Patient connected to nasal cannula oxygen  Post-op Assessment: Report given to PACU RN and Post -op Vital signs reviewed and stable  Post vital signs: Reviewed and stable  Complications: No apparent anesthesia complications

## 2014-02-11 NOTE — Op Note (Signed)
02/11/2014  1:02 PM  PATIENT:  Tiffany George  33 y.o. female  PRE-OPERATIVE DIAGNOSIS:  Missed Abortion at 10 3/7 wks  POST-OPERATIVE DIAGNOSIS:  Missed Abortion at 10 3/7 weeks  PROCEDURE:  Procedure(s): DILATATION AND EVACUATION  SURGEON:  Annamaria BootsMary Suzanne Braylon Grenda  ASSISTANTS: OR staff   ANESTHESIA:   MAC  ESTIMATED BLOOD LOSS: 100cc  BLOOD ADMINISTERED:none   FLUIDS: 1000ccLR  UOP: 50cc clear  SPECIMEN:  Products of Conception  DISPOSITION OF SPECIMEN:  PATHOLOGY  FINDINGS: Enlarged uterus 10-12 weeks in size.  DESCRIPTION OF OPERATION: Patient was taken to the operating room.  She is placed in the supine position. SCDs were on her lower extremities and functioning properly. General anesthesia with an LMA was administered without difficulty. Dr. Cristela BlueKyle Jackson oversaw case.  Legs were then placed in the [facility]llen stirrups in the low lithotomy position. The legs were lifted to the high lithotomy position and the Betadine prep was used on the inner thighs perineum and vagina x3. Patient was draped in a normal standard fashion. An in and out catheterization with a red rubber Foley catheter was performed. Approximately 50 cc of clear urine was noted. A heavy weighted speculum was placed the vagina. The anterior lip of the cervix was grasped with single-tooth tenaculum.  A paracervical block of 1% lidocaine mixed one-to-one with epinephrine (1:100,000 units).  10 cc was used total. The cervix is dilated up to #[facility]. The endometrial cavity sounded to 10.5 cm.  A #9 curved tip was obtained and passed through the cervix to the fundus of the uterus.  Suction was applied.  With a clock-wise motion, the tip was rotated within the uterine cavity.  Grayish tissue was seen as well as some clear fluid, in addition to blood which was consistent with POCs.  Suction was stopped and tip removed.  This was done a second time.  This time, almost no whitish tissue was seen.  Then using a #2 smooth  curette, the endometrial cavity was curetted until a smooth gritty texture was noted in all quadrants.  A final pass with the suction tip was performed.  Only a small amount of bleeding was noted at this time.    Procedure was ended. The tenaculum was removed from the anterior lip of the cervix. Scan bleeding was noted.  The speculum was removed from the vagina. The prep was cleansed of the patient's skin. The legs are positioned back in the supine position. Sponge, lap, needle, initially counts were correct x2. Patient was taken to recovery in stable condition.  COUNTS:  YES  PLAN OF CARE: Transfer to PACU

## 2014-02-11 NOTE — H&P (Signed)
Tiffany ReadingKelli L George is an 33 y.o. female  G1 MWF here for D&E due to missed abortion.  Pt is 10+ weeks pregnant.  Had ultrasound Tuesday due to spotting and no heart beat was seen.  This was confirmed with Doppler.  Pt and I discussed finidngs.  She was offered conservative management vs surgical intervention. S he has decided to proceed with D&E.  Pt here for this. Risks and benefits have been discussed via phone.  This is documented in prior note.  Patient here with spouse and ready to proceed.  Pertinent Gynecological History: Menses: amenorrhea Bleeding: amnenorrhea Contraception: none DES exposure: denies Blood transfusions: none Sexually transmitted diseases: no past history Previous GYN Procedures: none  Last mammogram: n/a Date: n/a Last pap: normal Date: 2013 OB History: G1, P0   Menstrual History: Patient's last menstrual period was 11/30/2013.    Past Medical History  Diagnosis Date  . Melasma 2012  . Complication of anesthesia     stated was hard to wake up p anesthesia 2008    Past Surgical History  Procedure Laterality Date  . Breast surgery      Augmentation    Family History  Problem Relation Age of Onset  . Cancer - Lung Paternal Grandfather     Social History:  reports that she has never smoked. She has never used smokeless tobacco. She reports that she drinks about 1.8 ounces of alcohol per week. She reports that she does not use illicit drugs.  Allergies: No Known Allergies  Prescriptions prior to admission  Medication Sig Dispense Refill  . acetaminophen (TYLENOL) 325 MG tablet Take 325 mg by mouth every 6 (six) hours as needed for moderate pain.      . Prenatal Vit-Fe Fumarate-FA (PRENATAL MULTIVITAMIN) TABS tablet Take 1 tablet by mouth daily at 12 noon.        Review of Systems  All other systems reviewed and are negative.   Blood pressure 120/79, pulse 90, temperature 98.1 F (36.7 C), temperature source Oral, resp. rate 18, last menstrual period  11/30/2013, SpO2 100.00%. Physical Exam  Vitals reviewed. Constitutional: She is oriented to person, place, and time. She appears well-developed and well-nourished.  Cardiovascular: Normal rate and regular rhythm.   Respiratory: Effort normal and breath sounds normal.  Musculoskeletal: Normal range of motion.  Neurological: She is alert and oriented to person, place, and time.  Skin: Skin is warm and dry.  Psychiatric: She has a normal mood and affect.    Results for orders placed during the hospital encounter of 02/11/14 (from the past 24 hour(s))  CBC     Status: None   Collection Time    02/11/14 10:43 AM      Result Value Ref Range   WBC 8.1  4.0 - 10.5 K/uL   RBC 4.39  3.87 - 5.11 MIL/uL   Hemoglobin 13.7  12.0 - 15.0 g/dL   HCT 28.440.1  13.236.0 - 44.046.0 %   MCV 91.3  78.0 - 100.0 fL   MCH 31.2  26.0 - 34.0 pg   MCHC 34.2  30.0 - 36.0 g/dL   RDW 10.213.0  72.511.5 - 36.615.5 %   Platelets 306  150 - 400 K/uL    [place] Ob Transvaginal  02/09/2014   SEE PROGRESS NOTES FOR RESULTS    Assessment/Plan: 33 yo G1 MWF with missed abortion.  Here for suction D&E.  All questions answered.  Ready to proceed.  Annamaria BootsMary Suzanne Aryannah Mohon 02/11/2014, 11:35 AM

## 2014-02-11 NOTE — Progress Notes (Signed)
Dr Maple HudsonMoser notified of onset of nausea after ambulating and drinking Sprite.  Orders received and phenergan given.

## 2014-02-12 ENCOUNTER — Encounter (HOSPITAL_COMMUNITY): Payer: Self-pay | Admitting: Obstetrics & Gynecology

## 2014-02-16 ENCOUNTER — Telehealth: Payer: Self-pay | Admitting: Obstetrics & Gynecology

## 2014-02-16 NOTE — Telephone Encounter (Signed)
Patient is still having some bad cramping and wanted to know if that normal after the procedure.

## 2014-02-16 NOTE — Telephone Encounter (Signed)
Spoke with patient. She is calling because she is having diarrhea. Today patient has had 3 loose BM with intermittent cramps in pelvic area and above belly button.  Has been having loose stool since Saturday morning after procedure. Unsure if r/t anesthesia or percocet. Took Percocet on Friday night and Sunday night. Now she is taking Motrin (last dose this morning) and Tylenol (last dose a few hours after motrin). She is eating and drinking normally, but feels she has to have a BM directly after eating. Has been using Tums without relief. Denies nausea, denies fevers, denies vaginal bleeding, denies vaginal discharge, denies blood in stool.  Advised patient that I would discuss with Dr. Hyacinth MeekerMiller and return her call with any instructions. Patient agreeable.    1500:  Spoke with Dr. Hyacinth MeekerMiller, to try comfort measures and follow up prn. Try otc immodium as instructions on package.  Increase hydration, water, Gatorade, non caffeinated clear sodas, avoid spicy foods, avoid milk products. Increase rest. To return call to our office if diarrhea continues tomorrow, develops fevers, increased abdominal pain. Patient states she must return to work tomorrow. Advised if continuing with symptoms to call office for office visit for evaluation. Patient verbalized understanding of instructions and symptoms that require follow up. Patient sounds in good spirits.   Routing to provider for final review. Patient agreeable to disposition. Will close encounter

## 2014-02-25 ENCOUNTER — Encounter: Payer: Self-pay | Admitting: Obstetrics & Gynecology

## 2014-02-25 ENCOUNTER — Ambulatory Visit (INDEPENDENT_AMBULATORY_CARE_PROVIDER_SITE_OTHER): Payer: BC Managed Care – PPO | Admitting: Obstetrics & Gynecology

## 2014-02-25 VITALS — BP 124/82 | HR 64 | Resp 16 | Ht 63.25 in | Wt 121.0 lb

## 2014-02-25 DIAGNOSIS — O021 Missed abortion: Secondary | ICD-10-CM

## 2014-02-25 DIAGNOSIS — Z8742 Personal history of other diseases of the female genital tract: Secondary | ICD-10-CM

## 2014-02-25 DIAGNOSIS — Z8759 Personal history of other complications of pregnancy, childbirth and the puerperium: Secondary | ICD-10-CM

## 2014-02-25 NOTE — Progress Notes (Signed)
Post Operative Visit  Procedure:D&E Days Post-op: 15  Subjective: Pt doing well.  Still feeling emotional--which I report to her is completely normal.  She is accompanied by her spouse today.  They are ready to keep trying for pregnancy.  Bleeding and cramping are gone.  Pt had some diarrhea a few days post-op but that is gone.  She is also interested in proceeding with testing for "recurrent miscarriages" although she knows this is early and she has only had one miscarriage.  She really doesn't want to go through this again, if possible.  D/W pt and spouse likelihood of successful pregnancy, even in light of doing nothing at this time with testing.  They still would like to proceed.  UPT is fainty + today.  Pt knows I don't want to do any blood work until this is completely back to normal.    Objective: VSS, reviewed personally  EXAM General: alert and cooperative Resp: clear to auscultation bilaterally Cardio: regular rate and rhythm, S1, S2 normal, no murmur, click, rub or gallop GI: soft, non-tender; bowel sounds normal; no masses,  no organomegaly Gyn:  NAEFG, vaginal normal, no discharge or bleeding, cervix closed, uterus normal size, no CMT Extremities: extremities normal, atraumatic, no cyanosis or edema Vaginal Bleeding: none  Assessment: s/p D&E for missed abortion  Plan: Return one week for UPT.  If negative, proceed with lab work.  Will check for Lupus anticoagulant and antiphospholipid antibodies.  Also will check TSH.  Pt knows to call with onset of cycle to schedule [facility] to assess for intracavitary lesions.  Will do karyotype on pt first.  If normal, spouse wants to do the same too.

## 2014-03-03 ENCOUNTER — Encounter: Payer: Self-pay | Admitting: Obstetrics & Gynecology

## 2014-03-03 ENCOUNTER — Ambulatory Visit (INDEPENDENT_AMBULATORY_CARE_PROVIDER_SITE_OTHER): Payer: BC Managed Care – PPO | Admitting: Gynecology

## 2014-03-03 ENCOUNTER — Other Ambulatory Visit: Payer: Self-pay | Admitting: Obstetrics & Gynecology

## 2014-03-03 DIAGNOSIS — O021 Missed abortion: Secondary | ICD-10-CM

## 2014-03-03 DIAGNOSIS — Z8759 Personal history of other complications of pregnancy, childbirth and the puerperium: Secondary | ICD-10-CM | POA: Insufficient documentation

## 2014-03-03 DIAGNOSIS — Z8742 Personal history of other diseases of the female genital tract: Secondary | ICD-10-CM

## 2014-03-03 DIAGNOSIS — E349 Endocrine disorder, unspecified: Secondary | ICD-10-CM

## 2014-03-03 LAB — POCT URINE PREGNANCY: Preg Test, Ur: POSITIVE

## 2014-03-03 NOTE — Progress Notes (Signed)
Patient ID: Tiffany George, female   DOB: Mar 09, 1981, 33 y.o.   MRN: [id number] Pt presented for nurse visit for UPT and labs.  UPT faintly positive.  Qualitative B-HCG drawn per staff message note.  Pt spoke to Dr. Farrel GobbleLathrop after blood draw for reassurance.

## 2014-03-03 NOTE — Progress Notes (Signed)
Pt here for lab work to f/u after recent MAB.  Pt with weakly positive UPT, labs deferred for now and HCG done instead.  Pt with questions regarding the recent miscarriage and the +UPT after surgery. I explained to pt that we should evalute the pregnancy hormone, discussed that sometimes it can be slow to come down but can come down on its own, sometimes other interventions may be needed but we cannot determine what will be at this time.  I explained that Dr Hyacinth MeekerMiller was out of the office today and would contact her with the results and the f/u needed.  Pt denies fever or chills. She is still sad about the recent loss and I assured her that that was normal. Questions addressed.

## 2014-03-04 LAB — HCG, SERUM, QUALITATIVE: Preg, Serum: POSITIVE

## 2014-03-04 LAB — HCG, QUANTITATIVE, PREGNANCY: hCG, Beta Chain, Quant, S: 36.8 m[IU]/mL

## 2014-03-05 ENCOUNTER — Telehealth: Payer: Self-pay | Admitting: Emergency Medicine

## 2014-03-05 NOTE — Telephone Encounter (Signed)
Spoke with patient and message from Dr. Hyacinth MeekerMiller given. She is agreeable to PUS, scheduled for 03/09/14 at 1430 and consult with Dr. Hyacinth MeekerMiller at 1500. Advised would receive a call to discuss benefits if different than prior.   Routing to provider for final review. Patient agreeable to disposition. Will close encounter

## 2014-03-05 NOTE — Telephone Encounter (Signed)
Message copied by Joeseph AmorFAST, Averill Winters L on Fri Mar 05, 2014  2:44 PM ------      Message from: Jerene BearsMILLER, MARY S      Created: Fri Mar 05, 2014 12:35 PM       Please call pt.  HCG is still +.  Level is 36.  This is quite low but I want to make sure it goes to <2 (which is the normal value).  Repeat next week and I also want to do a PUS to make sure the lining of the uterus is thin.  Fine to do this on Tues.  Order placed. ------

## 2014-03-05 NOTE — Addendum Note (Signed)
Addended by: Jerene BearsMILLER, Quill Grinder S on: 03/05/2014 12:36 PM   Modules accepted: Orders

## 2014-03-09 ENCOUNTER — Ambulatory Visit (INDEPENDENT_AMBULATORY_CARE_PROVIDER_SITE_OTHER): Payer: BC Managed Care – PPO

## 2014-03-09 ENCOUNTER — Ambulatory Visit (INDEPENDENT_AMBULATORY_CARE_PROVIDER_SITE_OTHER): Payer: BC Managed Care – PPO | Admitting: Obstetrics & Gynecology

## 2014-03-09 DIAGNOSIS — N83209 Unspecified ovarian cyst, unspecified side: Secondary | ICD-10-CM

## 2014-03-09 DIAGNOSIS — E349 Endocrine disorder, unspecified: Secondary | ICD-10-CM

## 2014-03-09 DIAGNOSIS — O021 Missed abortion: Secondary | ICD-10-CM

## 2014-03-09 NOTE — Progress Notes (Signed)
33 y.o.Marriedfemale here for a pelvic ultrasound.  Pt has continued to have + HCG since D&E for missed abortion.  She has a lot of anxiety about this and really needs to know that everything is ok.   UPT today is negative  Patient's last menstrual period was 11/30/2013.  Sexually active:  yes  Contraception: no method  FINDINGS: UTERUS: 8.5 x 5.0 x 3.6cm EMS: 0.689mm with no evidence of retained products  ADNEXA:   Left ovary 2.4 x 1.2 x 1.6cm   Right ovary 4.5 x 4.2 x 2.1cm with 4.1cm cyst with internal echoes, avascular, possible hemorrhagic ovarian cyst CUL DE SAC: no free fluid  Images reviewed with pt.  She can proceed with blood work today.  She is so relieved.  Reviewed findings of cyst with pt.  This wasn't present prior to surgery so this should resolve  Assessment:  Hemorrhagic ovarian cyst H/O missed abortion  Plan: antiphospholipid antibody testing, Lupus anticoagulant testing, chromosomal analysis testing obtained today  ~15 minutes spent with patient >50% of time was in face to face discussion of above.

## 2014-03-11 ENCOUNTER — Encounter: Payer: Self-pay | Admitting: Obstetrics & Gynecology

## 2014-03-11 NOTE — Addendum Note (Signed)
Addended by: Jerene BearsMILLER, Ahmad Vanwey S on: 03/11/2014 05:44 PM   Modules accepted: Orders

## 2014-03-16 LAB — ANTIPHOSPHOLIPID SYNDROME EVAL, BLD
ANTICARDIOLIPIN IGA: 5 U/mL (ref <22)
ANTICARDIOLIPIN IGG: 0 GPL U/mL (ref <23)
ANTICARDIOLIPIN IGM: 6 [MPL'U]/mL (ref <11)
DRVVT: 28.5 s (ref <42.9)
Lupus Anticoagulant: NOT DETECTED
PTT LA: 34.6 s (ref 28.0–43.0)
Phosphatidylserine IgG Autoantibodies: 10 U/mL (ref <16)
Phosphatydalserine, IgA: 5 U/mL (ref <20)
Phosphatydalserine, IgM: 6 U/mL (ref <22)

## 2014-03-18 ENCOUNTER — Telehealth: Payer: Self-pay | Admitting: Obstetrics & Gynecology

## 2014-03-18 NOTE — Telephone Encounter (Signed)
Patient says her cycle is very light and is unsure if she need to take her medication.

## 2014-03-18 NOTE — Telephone Encounter (Signed)
Discussed with Dr. Hyacinth MeekerMiller.   Okay to start Femara on cycle days 5-9, light cycle still counts, can start this month if she would like to or wait until next month.  Advised Antiphospholipid testing (clotting testins)-WNL. Chromosomal testing is pending.   Patient states she will feeling good. Will call back prn.   Routing to provider for final review. Patient agreeable to disposition. Will close encounter

## 2014-03-21 LAB — CHROMOSOME ANALYSIS, PERIPHERAL BLOOD

## 2014-03-25 ENCOUNTER — Telehealth: Payer: Self-pay | Admitting: Emergency Medicine

## 2014-03-25 NOTE — Telephone Encounter (Signed)
Message copied by Joeseph AmorFAST, Clarice Bonaventure L on Thu Mar 25, 2014  1:35 PM ------      Message from: Jerene BearsMILLER, MARY S      Created: Mon Mar 22, 2014  6:58 AM       Please inform pt that her chromosomal testing was normal as well.  Does her husband want to proceed with this or do they want to wait? ------

## 2014-03-25 NOTE — Telephone Encounter (Signed)
Spoke with patient. She states she is feeling well. She states her husband will do the chromosomal testing.  She also started Femara on cycle day 5-9. Today is day 10 for her. She wants to know if she needs to do progesterone testing. Day one was 5/12. Advised would send message to Dr. Hyacinth MeekerMiller to confirm that she needs to do 23 day testing or not. Also would need orders for husband for testing, husband name: Everlena CooperRyan Virrueta.

## 2014-04-01 NOTE — Telephone Encounter (Signed)
Spoke with patient and message from Dr. Hyacinth Meeker given.  Orders faxed with faxed confirmation received to [facility] labs at [phone] for [facility] DOB 06/19/1981, lab hours given.   Routing to provider for final review. Patient agreeable to disposition. Will close encounter

## 2014-04-01 NOTE — Telephone Encounter (Signed)
Will write RX for husband's test and we can fax upstairs.  Pt doesn't need Day 23 progesterone.  Was good the last time.

## 2014-04-01 NOTE — Telephone Encounter (Signed)
Message left to return call to [place] at [phone].   Need Husband DOB for order and will fax to [facility] labs and give message to patient.

## 2014-04-16 ENCOUNTER — Telehealth: Payer: Self-pay | Admitting: Obstetrics & Gynecology

## 2014-04-16 MED ORDER — LETROZOLE 2.5 MG PO TABS: ORAL_TABLET | ORAL | Status: DC

## 2014-04-16 NOTE — Telephone Encounter (Signed)
Left message to advise patient that rx was sent for her for pick up, okay per designated party release form. Call us back with any questions.  Routing to provider for final review. Patient agreeable to disposition. Will close encounter

## 2014-04-16 NOTE — Telephone Encounter (Signed)
Reviewed with Dr. Hyacinth MeekerMiller. Order placed.

## 2014-04-16 NOTE — Telephone Encounter (Signed)
Patient states she started her period today. She is requesting refill Femara for cycle days 5-9. She will be out of town on Tuesday and would like refill placed so she can pick up before leaving out of town.  Advised would send her request for Femara to Dr. Hyacinth MeekerMiller for review and would call her back when rx sent.  She is agreeable. Also states husband had chromosomal testing.   Order for Femara Pended for review and signature.

## 2014-04-16 NOTE — Telephone Encounter (Signed)
Pt wants to talk with [facility]. Pt states it is about her medication.

## 2014-05-17 ENCOUNTER — Telehealth: Payer: Self-pay | Admitting: Obstetrics & Gynecology

## 2014-05-17 MED ORDER — LETROZOLE 2.5 MG PO TABS: ORAL_TABLET | ORAL | Status: DC

## 2014-05-17 NOTE — Telephone Encounter (Signed)
Pt says she is not pregnant and need more Femara.

## 2014-05-17 NOTE — Telephone Encounter (Signed)
Dr. Hyacinth MeekerMiller, okay to re-order Femara 2.5 mg 3 tablets daily cycle days 5-9?

## 2014-05-17 NOTE — Telephone Encounter (Signed)
Order signed.  Thank you.  Encounter closed.

## 2014-05-18 NOTE — Telephone Encounter (Signed)
Spoke with patient and she is aware rx sent.  Will follow up as needed for next month.  Encounter closed by Dr. Hyacinth MeekerMiller.

## 2014-06-08 ENCOUNTER — Telehealth: Payer: Self-pay | Admitting: Obstetrics & Gynecology

## 2014-06-08 NOTE — Telephone Encounter (Signed)
Spoke with patient at time of incoming call.  Patient states she is currently on day 23 of cycle and that she ovulated on day 13 (05/29/14). Last night, patient had some very light spotting and then this morning very light spotting. No bleeding now. Patient denies abdominal pain, dysuria or pain with intercourse at this time. Took home pregnancy test this morning and was negative, patient wondering if this may be implantation bleeding.  Patient also states that on 06/01/14, she developed intermittent vaginal itching that has since resolved. Denies discharge or other concerns. But states "I can just feel it." Advised may have irritated area from itching. Can use otc hydrocortisone ointment to see if helps.  Offered office visit for evaluation of her concerns and patient declines, states "I will just wait and see."  Advised would send message to Dr. Hyacinth MeekerMiller for review and would call back if has any further instructions, she is agreeable.

## 2014-06-08 NOTE — Telephone Encounter (Signed)
Pt says her right implant is bleeding.

## 2014-06-09 NOTE — Telephone Encounter (Signed)
Actually she could have implantation bleeding.  Will need to wait and see if has + UPT.  She should do this around day 28 and let me know results.  If not pregnant and itching continues, may need appt to rule out yeast.  I don't want to treat right now as could be very early pregnant and I would prefer her to be a little further along if pregnant with testing showing yeast.  Just ask her to call back with results.  Thanks.

## 2014-06-09 NOTE — Telephone Encounter (Signed)
Patient called with message from Dr. Hyacinth MeekerMiller. She states vaginal itching has stopped.  She is having still some very light spotting only.  Advised to test at day 28 and call us with results or if any symptoms change. She is agreeable to this.

## 2014-06-16 ENCOUNTER — Telehealth: Payer: Self-pay | Admitting: Emergency Medicine

## 2014-06-16 DIAGNOSIS — Z8759 Personal history of other complications of pregnancy, childbirth and the puerperium: Secondary | ICD-10-CM

## 2014-06-16 MED ORDER — LETROZOLE 2.5 MG PO TABS: ORAL_TABLET | ORAL | Status: DC

## 2014-06-16 NOTE — Telephone Encounter (Signed)
Order for Femara placed.  Message left to return call to [facility] at [phone].   Patient was seen in office by Dr. April MansonYalcinkaya on 12/17/13.   Pt returned call. She will call Dr. Lyndal RainbowYalcinkaya's office and request appointment. Established patient there. Advised to call if has issue with obtaining appointment. She is agreeable. Will take Femara cycle days 5-9 this month.  [facility] [place] [facility] [facility]. Routing to provider for final review. Patient agreeable to disposition. Will close encounter

## 2014-06-16 NOTE — Telephone Encounter (Signed)
I would like her to continue the Femara this month but time, I think, to see Dr. April MansonYalcinkaya.  I'm not sure if a referral is needed as she had an appointment prior.  Not sure she went as she had a +UPT with missed abortion and needed a D&E.  Anyway, please go ahead with referral if needed.

## 2014-06-16 NOTE — Telephone Encounter (Signed)
Patient calling to report that she has started her cycle. Today is cycle day 31. She took a home pregnancy test on cycle day 28 (Sunday) that was negative. Also took a home pregnancy test today which was negative. Patient denies any complaints. Tearful when stating "I am wondering why I was spotting so much, I really thought I was pregnant."  Patient states this was brown spotting and it was intermittent. Advised that spotting is normal prior to starting periods.   I advised I would send a message to Dr. Hyacinth MeekerMiller to review and advise for next steps. Will patient continue with Femara 7.5 mg days 5-9 this cycle?  Needs referral at this time?

## 2014-06-28 ENCOUNTER — Telehealth: Payer: Self-pay | Admitting: Obstetrics & Gynecology

## 2014-06-28 NOTE — Telephone Encounter (Signed)
Patient has some questions for Dr.Miller after seeing Dr.Yalcinkya.

## 2014-06-28 NOTE — Telephone Encounter (Signed)
Spoke with patient. Patient states that she was seen with Dr.Yalcinkaya last week and had an HSG done. Patient states "Everything was normal. There was no blockage, the blood supply was good, and he saw two follicles in there. The only thing was that he said my lining was thin and was not sure if it had always been this way or is it could be from the Femara. Since my blood supply was good he wasn't sure since those usually go together. I didn't know if this is something that Dr.Miller would know or have record of." States that she is supposed to let Dr.Yalcinkaya know in two weeks if she has a positive pregnancy test or if she has started her cycle. Advised patient that I would send a message over to Dr.Miller for review and advise and give patient a call back. Patient is agreeable.

## 2014-06-28 NOTE — Telephone Encounter (Signed)
Can I have her paper chart to see if there are any ultrasound pictures in it.

## 2014-06-29 NOTE — Telephone Encounter (Signed)
Chart to your desk for review and advise.

## 2014-07-01 NOTE — Telephone Encounter (Signed)
Left message to call Shelisa Fern at [phone]. 

## 2014-07-01 NOTE — Telephone Encounter (Signed)
Please let her know I reviewed all her old ultrasounds.  There was only one that wasn't done while pregnant.  Her lining was thin then as well but it was pretty close after the surgery so that isn't all that surprising.  Sorry we don't have any other comparisons.  Ok to close encounter if no other questions.

## 2014-07-01 NOTE — Telephone Encounter (Signed)
Spoke with patient. Advised of message as seen below from Dr.Miller. Patient agreeable and verbalizes understanding. Patient will wait the two weeks and follow up with Dr.Yalcinkaya.  Routing to provider for final review. Patient agreeable to disposition. Will close encounter

## 2014-09-06 ENCOUNTER — Encounter: Payer: Self-pay | Admitting: Obstetrics & Gynecology

## 2014-09-14 LAB — OB RESULTS CONSOLE HIV ANTIBODY (ROUTINE TESTING): HIV: NONREACTIVE

## 2014-09-14 LAB — OB RESULTS CONSOLE GBS: GBS: NEGATIVE

## 2014-09-14 LAB — OB RESULTS CONSOLE RPR: RPR: NONREACTIVE

## 2014-09-14 LAB — OB RESULTS CONSOLE ABO/RH: RH Type: POSITIVE

## 2014-09-14 LAB — OB RESULTS CONSOLE RUBELLA ANTIBODY, IGM: RUBELLA: IMMUNE

## 2014-09-14 LAB — OB RESULTS CONSOLE HEPATITIS B SURFACE ANTIGEN: Hepatitis B Surface Ag: NEGATIVE

## 2014-09-21 ENCOUNTER — Other Ambulatory Visit: Payer: Self-pay | Admitting: Obstetrics and Gynecology

## 2014-09-21 LAB — OB RESULTS CONSOLE GC/CHLAMYDIA
Chlamydia: NEGATIVE
Gonorrhea: NEGATIVE

## 2014-09-23 LAB — CYTOLOGY - PAP

## 2014-10-21 ENCOUNTER — Other Ambulatory Visit (HOSPITAL_COMMUNITY): Payer: Self-pay | Admitting: Obstetrics and Gynecology

## 2014-10-21 DIAGNOSIS — Z3689 Encounter for other specified antenatal screening: Secondary | ICD-10-CM

## 2014-10-21 DIAGNOSIS — O283 Abnormal ultrasonic finding on antenatal screening of mother: Secondary | ICD-10-CM

## 2014-10-21 DIAGNOSIS — Z3A18 18 weeks gestation of pregnancy: Secondary | ICD-10-CM

## 2014-10-22 ENCOUNTER — Ambulatory Visit (HOSPITAL_COMMUNITY)
Admission: RE | Admit: 2014-10-22 | Discharge: 2014-10-22 | Disposition: A | Discharge status: Home / Self Care | Payer: BC Managed Care – PPO | Source: Ambulatory Visit | Attending: Obstetrics and Gynecology | Admitting: Obstetrics and Gynecology

## 2014-10-22 ENCOUNTER — Other Ambulatory Visit (HOSPITAL_COMMUNITY): Payer: Self-pay

## 2014-10-22 ENCOUNTER — Other Ambulatory Visit (HOSPITAL_COMMUNITY): Payer: Self-pay | Admitting: Obstetrics and Gynecology

## 2014-10-22 ENCOUNTER — Encounter (HOSPITAL_COMMUNITY): Payer: Self-pay

## 2014-10-22 DIAGNOSIS — Z3A18 18 weeks gestation of pregnancy: Secondary | ICD-10-CM | POA: Insufficient documentation

## 2014-10-22 DIAGNOSIS — Z36 Encounter for antenatal screening of mother: Secondary | ICD-10-CM | POA: Insufficient documentation

## 2014-10-22 DIAGNOSIS — Z3689 Encounter for other specified antenatal screening: Secondary | ICD-10-CM

## 2014-10-22 DIAGNOSIS — O283 Abnormal ultrasonic finding on antenatal screening of mother: Secondary | ICD-10-CM

## 2014-10-22 DIAGNOSIS — O4412 Placenta previa with hemorrhage, second trimester: Secondary | ICD-10-CM | POA: Insufficient documentation

## 2014-10-22 NOTE — Consult Note (Signed)
Maternal Fetal Medicine Consultation  Requesting Provider(s): Marcelle OverlieMichelle Grewal, MD  Reason for consultation: Suspected vasa previa  HPI: Tiffany ReadingKelli George George is a 33 yo G2P0010, EDD 03/21/2014 who is currently at 18w 4d seen for consultation due to suspected vasa previa on outside ultrasound.  She reports some first trimester vaginal spotting - her prenatal course has otherwise been uncomplicated.  She is without complaints today.  OB History: OB History    Gravida Para Term Preterm AB TAB SAB Ectopic Multiple Living   2 0 0 0 1 0 1 0 0 0       PMH:  Past Medical History  Diagnosis Date  . Melasma 2012  . Complication of anesthesia     stated was hard to wake up p anesthesia 2008    PSH:  Past Surgical History  Procedure Laterality Date  . Breast surgery      Augmentation  . Dilation and evacuation N/A 02/11/2014    Procedure: DILATATION AND EVACUATION;  Surgeon: Annamaria BootsMary Suzanne Miller, MD;  Location: [facility];  Service: Gynecology;  Laterality: N/A;   Meds: Prenatal vitamins  Allergies:  Allergies  Allergen Reactions  . Vicodin [Hydrocodone-Acetaminophen] Nausea And Vomiting   FH:  Family History  Problem Relation Age of Onset  . Cancer - Lung Paternal Grandfather    Soc:  History   Social History  . Marital Status: Married    Spouse Name: N/A    Number of Children: N/A  . Years of Education: N/A   Occupational History  . Not on file.   Social History Main Topics  . Smoking status: Never Smoker   . Smokeless tobacco: Never Used  . Alcohol Use: No  . Drug Use: No  . Sexual Activity:    Partners: Male    Birth Control/ Protection: None   Other Topics Concern  . Not on file   Social History Narrative    Review of Systems: no vaginal bleeding or cramping/contractions, no LOF, no nausea/vomiting. All other systems reviewed and are negative.  PE:  130 lbs, 134/88, 101  Ultrasound: Single IUP at 18w 6d Normal fetal anatomic survey An anterior low lying placenta  is noted. A marginal cord insertion is noted along the leading edge of the placenta.   The umbilical cord and fetal vessels course along the internal os - likely vasa previa and funic presentation Ultrasound measurements consistent with dates Normal amniotic fluid volume  A/P:  1) Single IUP at 18w 4d  2) Vasa previa/ funic presentation - ultrasound findings and limitations were discussed in detail.  At this point, fetal vessels and cord are clearly seen coursing along the internal os.  The cervical length is normal and an anterior low lying placenta was noted.  The patient and her husband are aware of the potential concerns.  Fortunately, a velamentous cord insertion is not appreciated, so do not feel that the risk of injury to fetal vessels is dramatic; nevertheless, if the location of the fetal vessels persists would recommend early delivery via cesarean section and hospitalization in the 3rd trimester.    Recommendations: 1) Close follow up - patient is scheduled for repeat ultrasound and cervical length in 4 weeks and transvaginal ultrasounds every several weeks thereafter if fetal vessels persist in the area of the internal os. 2) If the location of the fetal vessels persists, would recommend a course of betamethasone and hospitalization at [redacted] weeks gestation. 3) If vasa previa is noted in the 3rd trimester - would recommend  delivery via C-section at 34 weeks or earlier as the clinical scenario dictates.   Thank you for the opportunity to be a part of the care of Tiffany George. Please contact our office if we can be of further assistance.   I spent approximately 30 minutes with this patient with over 50% of time spent in face-to-face counseling.  Alpha GulaPaul Jimmi Sidener, MD Maternal Fetal Medicine

## 2014-11-19 ENCOUNTER — Encounter (HOSPITAL_COMMUNITY): Payer: Self-pay

## 2014-11-19 ENCOUNTER — Ambulatory Visit (HOSPITAL_COMMUNITY)
Admission: RE | Admit: 2014-11-19 | Discharge: 2014-11-19 | Disposition: A | Discharge status: Home / Self Care | Payer: BLUE CROSS/BLUE SHIELD | Source: Ambulatory Visit | Attending: Obstetrics and Gynecology | Admitting: Obstetrics and Gynecology

## 2014-11-19 DIAGNOSIS — Z3A22 22 weeks gestation of pregnancy: Secondary | ICD-10-CM | POA: Diagnosis not present

## 2014-11-19 DIAGNOSIS — IMO0002 Reserved for concepts with insufficient information to code with codable children: Secondary | ICD-10-CM | POA: Insufficient documentation

## 2014-12-17 ENCOUNTER — Ambulatory Visit (HOSPITAL_COMMUNITY): Payer: BLUE CROSS/BLUE SHIELD

## 2014-12-17 ENCOUNTER — Encounter (HOSPITAL_COMMUNITY): Payer: Self-pay

## 2014-12-17 ENCOUNTER — Other Ambulatory Visit (HOSPITAL_COMMUNITY): Payer: Self-pay | Admitting: Maternal and Fetal Medicine

## 2014-12-17 ENCOUNTER — Ambulatory Visit (HOSPITAL_COMMUNITY)
Admission: RE | Admit: 2014-12-17 | Discharge: 2014-12-17 | Disposition: A | Discharge status: Home / Self Care | Payer: BLUE CROSS/BLUE SHIELD | Source: Ambulatory Visit | Attending: Obstetrics and Gynecology | Admitting: Obstetrics and Gynecology

## 2014-12-17 DIAGNOSIS — Z3A26 26 weeks gestation of pregnancy: Secondary | ICD-10-CM | POA: Insufficient documentation

## 2014-12-17 DIAGNOSIS — IMO0002 Reserved for concepts with insufficient information to code with codable children: Secondary | ICD-10-CM

## 2014-12-17 DIAGNOSIS — Z3A22 22 weeks gestation of pregnancy: Secondary | ICD-10-CM

## 2014-12-17 DIAGNOSIS — O43109 Malformation of placenta, unspecified, unspecified trimester: Secondary | ICD-10-CM | POA: Insufficient documentation

## 2015-02-26 ENCOUNTER — Inpatient Hospital Stay (HOSPITAL_COMMUNITY)
Admission: AD | Admit: 2015-02-26 | Discharge: 2015-02-26 | Disposition: A | Discharge status: Home / Self Care | Payer: BLUE CROSS/BLUE SHIELD | Source: Ambulatory Visit | Attending: Obstetrics & Gynecology | Admitting: Obstetrics & Gynecology

## 2015-02-26 ENCOUNTER — Encounter (HOSPITAL_COMMUNITY): Payer: Self-pay | Admitting: *Deleted

## 2015-02-26 DIAGNOSIS — O9989 Other specified diseases and conditions complicating pregnancy, childbirth and the puerperium: Secondary | ICD-10-CM | POA: Diagnosis not present

## 2015-02-26 DIAGNOSIS — R102 Pelvic and perineal pain: Secondary | ICD-10-CM | POA: Insufficient documentation

## 2015-02-26 DIAGNOSIS — Z3A37 37 weeks gestation of pregnancy: Secondary | ICD-10-CM | POA: Diagnosis not present

## 2015-02-26 LAB — COMPREHENSIVE METABOLIC PANEL
ALBUMIN: 2.8 g/dL — AB (ref 3.5–5.2)
ALT: 13 U/L (ref 0–35)
ANION GAP: 7 (ref 5–15)
AST: 22 U/L (ref 0–37)
Alkaline Phosphatase: 161 U/L — ABNORMAL HIGH (ref 39–117)
BUN: 10 mg/dL (ref 6–23)
CALCIUM: 9.1 mg/dL (ref 8.4–10.5)
CHLORIDE: 107 mmol/L (ref 96–112)
CO2: 22 mmol/L (ref 19–32)
Creatinine, Ser: 0.52 mg/dL (ref 0.50–1.10)
GFR calc Af Amer: >90 mL/min (ref >90)
Glucose, Bld: 84 mg/dL (ref 70–99)
Potassium: 3.9 mmol/L (ref 3.5–5.1)
Sodium: 136 mmol/L (ref 135–145)
Total Bilirubin: 0.4 mg/dL (ref 0.3–1.2)
Total Protein: 5.7 g/dL — ABNORMAL LOW (ref 6.0–8.3)

## 2015-02-26 LAB — CBC
HCT: 35.7 % — ABNORMAL LOW (ref 36.0–46.0)
HEMOGLOBIN: 12.2 g/dL (ref 12.0–15.0)
MCH: 31.9 pg (ref 26.0–34.0)
MCHC: 34.2 g/dL (ref 30.0–36.0)
MCV: 93.5 fL (ref 78.0–100.0)
PLATELETS: 204 10*3/uL (ref 150–400)
RBC: 3.82 MIL/uL — AB (ref 3.87–5.11)
RDW: 13.9 % (ref 11.5–15.5)
WBC: 10.3 10*3/uL (ref 4.0–10.5)

## 2015-02-26 LAB — WET PREP, GENITAL
Clue Cells Wet Prep HPF POC: NONE SEEN
TRICH WET PREP: NONE SEEN

## 2015-02-26 LAB — URINALYSIS, ROUTINE W REFLEX MICROSCOPIC
Bilirubin Urine: NEGATIVE
GLUCOSE, UA: NEGATIVE mg/dL
Ketones, ur: NEGATIVE mg/dL
Nitrite: NEGATIVE
Protein, ur: NEGATIVE mg/dL
SPECIFIC GRAVITY, URINE: 1.025 (ref 1.005–1.030)
UROBILINOGEN UA: 0.2 mg/dL (ref 0.0–1.0)
pH: 7 (ref 5.0–8.0)

## 2015-02-26 LAB — URINE MICROSCOPIC-ADD ON

## 2015-02-26 LAB — PROTEIN / CREATININE RATIO, URINE
Creatinine, Urine: 124 mg/dL
Protein Creatinine Ratio: 0.27 — ABNORMAL HIGH (ref 0.00–0.15)
Total Protein, Urine: 33 mg/dL

## 2015-02-26 NOTE — MAU Note (Signed)
Pt came in c/o mucus discharge and sharp vaginal pain.  Denies VB,

## 2015-02-26 NOTE — Discharge Instructions (Signed)
Hypertension During Pregnancy Hypertension, or high blood pressure, is when there is extra pressure inside your blood vessels that carry blood from the heart to the rest of your body (arteries). It can happen at any time in life, including pregnancy. Hypertension during pregnancy can cause problems for you and your baby. Your baby might not weigh as much as he or she should at birth or might be born early (premature). Very bad cases of hypertension during pregnancy can be life-threatening.  Different types of hypertension can occur during pregnancy. These include:  Chronic hypertension. This happens when a woman has hypertension before pregnancy and it continues during pregnancy.  Gestational hypertension. This is when hypertension develops during pregnancy.  Preeclampsia or toxemia of pregnancy. This is a very serious type of hypertension that develops only during pregnancy. It affects the whole body and can be very dangerous for both mother and baby.  Gestational hypertension and preeclampsia usually go away after your baby is born. Your blood pressure will likely stabilize within 6 weeks. Women who have hypertension during pregnancy have a greater chance of developing hypertension later in life or with future pregnancies. RISK FACTORS There are certain factors that make it more likely for you to develop hypertension during pregnancy. These include: 1. Having hypertension before pregnancy. 2. Having hypertension during a previous pregnancy. 3. Being overweight. 4. Being older than 40 years. 5. Being pregnant with more than one baby. 6. Having diabetes or kidney problems. SIGNS AND SYMPTOMS Chronic and gestational hypertension rarely cause symptoms. Preeclampsia has symptoms, which may include:  Increased protein in your urine. Your health care provider will check for this at every prenatal visit.  Swelling of your hands and face.  Rapid weight gain.  Headaches.  Visual  changes.  Being bothered by light.  Abdominal pain, especially in the upper right area.  Chest pain.  Shortness of breath.  Increased reflexes.  Seizures. These occur with a more severe form of preeclampsia, called eclampsia. DIAGNOSIS  You may be diagnosed with hypertension during a regular prenatal exam. At each prenatal visit, you may have:  Your blood pressure checked.  A urine test to check for protein in your urine. The type of hypertension you are diagnosed with depends on when you developed it. It also depends on your specific blood pressure reading.  Developing hypertension before 20 weeks of pregnancy is consistent with chronic hypertension.  Developing hypertension after 20 weeks of pregnancy is consistent with gestational hypertension.  Hypertension with increased urinary protein is diagnosed as preeclampsia.  Blood pressure measurements that stay above 160 systolic or 110 diastolic are a sign of severe preeclampsia. TREATMENT Treatment for hypertension during pregnancy varies. Treatment depends on the type of hypertension and how serious it is.  If you take medicine for chronic hypertension, you may need to switch medicines.  Medicines called ACE inhibitors should not be taken during pregnancy.  Low-dose aspirin may be suggested for women who have risk factors for preeclampsia.  If you have gestational hypertension, you may need to take a blood pressure medicine that is safe during pregnancy. Your health care provider will recommend the correct medicine.  If you have severe preeclampsia, you may need to be in the hospital. Health care providers will watch you and your baby very closely. You also may need to take medicine called magnesium sulfate to prevent seizures and lower blood pressure.  Sometimes, an early delivery is needed. This may be the case if the condition worsens. It would be  done to protect you and your baby. The only cure for preeclampsia is  delivery.  Your health care provider may recommend that you take one low-dose aspirin (81 mg) each day to help prevent high blood pressure during your pregnancy if you are at risk for preeclampsia. You may be at risk for preeclampsia if:  You had preeclampsia or eclampsia during a previous pregnancy.  Your baby did not grow as expected during a previous pregnancy.  You experienced preterm birth with a previous pregnancy.  You experienced a separation of the placenta from the uterus (placental abruption) during a previous pregnancy.  You experienced the loss of your baby during a previous pregnancy.  You are pregnant with more than one baby.  You have other medical conditions, such as diabetes or an autoimmune disease. HOME CARE INSTRUCTIONS  Schedule and keep all of your regular prenatal care appointments. This is important.  Take medicines only as directed by your health care provider. Tell your health care provider about all medicines you take.  Eat as little salt as possible.  Get regular exercise.  Do not drink alcohol.  Do not use tobacco products.  Do not drink products with caffeine.  Lie on your left side when resting. SEEK IMMEDIATE MEDICAL CARE IF:  You have severe abdominal pain.  You have sudden swelling in your hands, ankles, or face.  You gain 4 pounds (1.8 kg) or more in 1 week.  You vomit repeatedly.  You have vaginal bleeding.  You do not feel your baby moving as much.  You have a headache.  You have blurred or double vision.  You have muscle twitching or spasms.  You have shortness of breath.  You have blue fingernails or lips.  You have blood in your urine. MAKE SURE YOU:  Understand these instructions.  Will watch your condition.  Will get help right away if you are not doing well or get worse. Document Released: 07/10/2011 Document Revised: 03/08/2014 Document Reviewed: 05/21/2013 [facility] Patient Information 2015 [place],  [place]. This information is not intended to replace advice given to you by your health care provider. Make sure you discuss any questions you have with your health care provider.   Preeclampsia and Eclampsia Preeclampsia is a serious condition that develops only during pregnancy. It is also called toxemia of pregnancy. This condition causes high blood pressure along with other symptoms, such as swelling and headaches. These may develop as the condition gets worse. Preeclampsia may occur 20 weeks or later into your pregnancy.  Diagnosing and treating preeclampsia early is very important. If not treated early, it can cause serious problems for you and your baby. One problem it can lead to is eclampsia, which is a condition that causes muscle jerking or shaking (convulsions) in the mother. Delivering your baby is the best treatment for preeclampsia or eclampsia.  RISK FACTORS The cause of preeclampsia is not known. You may be more likely to develop preeclampsia if you have certain risk factors. These include:   Being pregnant for the first time.  Having preeclampsia in a past pregnancy.  Having a family history of preeclampsia.  Having high blood pressure.  Being pregnant with twins or triplets.  Being 27 or older.  Being African American.  Having kidney disease or diabetes.  Having medical conditions such as lupus or blood diseases.  Being very overweight (obese). SIGNS AND SYMPTOMS  The earliest signs of preeclampsia are: 7. High blood pressure. 8. Increased protein in your urine. Your health  care provider will check for this at every prenatal visit. Other symptoms that can develop include:   Severe headaches.  Sudden weight gain.  Swelling of your hands, face, legs, and feet.  Feeling sick to your stomach (nauseous) and throwing up (vomiting).  Vision problems (blurred or double vision).  Numbness in your face, arms, legs, and feet.  Dizziness.  Slurred  speech.  Sensitivity to bright lights.  Abdominal pain. DIAGNOSIS  There are no screening tests for preeclampsia. Your health care provider will ask you about symptoms and check for signs of preeclampsia during your prenatal visits. You may also have tests, including:  Urine testing.  Blood testing.  Checking your baby's heart rate.  Checking the health of your baby and your placenta using images created with sound waves (ultrasound). TREATMENT  You can work out the best treatment approach together with your health care provider. It is very important to keep all prenatal appointments. If you have an increased risk of preeclampsia, you may need more frequent prenatal exams.  Your health care provider may prescribe bed rest.  You may have to eat as little salt as possible.  You may need to take medicine to lower your blood pressure if the condition does not respond to more conservative measures.  You may need to stay in the hospital if your condition is severe. There, treatment will focus on controlling your blood pressure and fluid retention. You may also need to take medicine to prevent seizures.  If the condition gets worse, your baby may need to be delivered early to protect you and the baby. You may have your labor started with medicine (be induced), or you may have a cesarean delivery.  Preeclampsia usually goes away after the baby is born. HOME CARE INSTRUCTIONS   Only take over-the-counter or prescription medicines as directed by your health care provider.  Lie on your left side while resting. This keeps pressure off your baby.  Elevate your feet while resting.  Get regular exercise. Ask your health care provider what type of exercise is safe for you.  Avoid caffeine and alcohol.  Do not smoke.  Drink 6-8 glasses of water every day.  Eat a balanced diet that is low in salt. Do not add salt to your food.  Avoid stressful situations as much as possible.  Get  plenty of rest and sleep.  Keep all prenatal appointments and tests as scheduled. SEEK MEDICAL CARE IF:  You are gaining more weight than expected.  You have any headaches, abdominal pain, or nausea.  You are bruising more than usual.  You feel dizzy or light-headed. SEEK IMMEDIATE MEDICAL CARE IF:   You develop sudden or severe swelling anywhere in your body. This usually happens in the legs.  You gain 5 lb (2.3 kg) or more in a week.  You have a severe headache, dizziness, problems with your vision, or confusion.  You have severe abdominal pain.  You have lasting nausea or vomiting.  You have a seizure.  You have trouble moving any part of your body.  You develop numbness in your body.  You have trouble speaking.  You have any abnormal bleeding.  You develop a stiff neck.  You pass out. MAKE SURE YOU:   Understand these instructions.  Will watch your condition.  Will get help right away if you are not doing well or get worse. Document Released: 10/19/2000 Document Revised: 10/27/2013 Document Reviewed: 08/14/2013 [facility] Patient Information 2015 [place]. This information is  not intended to replace advice given to you by your health care provider. Make sure you discuss any questions you have with your health care provider.   Fetal Movement Counts Patient Name: __________________________________________________ Patient Due Date: ____________________ Performing a fetal movement count is highly recommended in high-risk pregnancies, but it is good for every pregnant woman to do. Your health care provider may ask you to start counting fetal movements at 28 weeks of the pregnancy. Fetal movements often increase:  After eating a full meal.  After physical activity.  After eating or drinking something sweet or cold.  At rest. Pay attention to when you feel the baby is most active. This will help you notice a pattern of your baby's sleep and wake cycles and  what factors contribute to an increase in fetal movement. It is important to perform a fetal movement count at the same time each day when your baby is normally most active.  HOW TO COUNT FETAL MOVEMENTS 9. Find a quiet and comfortable area to sit or lie down on your left side. Lying on your left side provides the best blood and oxygen circulation to your baby. 10. Write down the day and time on a sheet of paper or in a journal. 11. Start counting kicks, flutters, swishes, rolls, or jabs in a 2-hour period. You should feel at least 10 movements within 2 hours. 12. If you do not feel 10 movements in 2 hours, wait 2-3 hours and count again. Look for a change in the pattern or not enough counts in 2 hours. SEEK MEDICAL CARE IF:  You feel less than 10 counts in 2 hours, tried twice.  There is no movement in over an hour.  The pattern is changing or taking longer each day to reach 10 counts in 2 hours.  You feel the baby is not moving as he or she usually does. Date: ____________ Movements: ____________ Start time: ____________ Doreatha Martin time: ____________  Date: ____________ Movements: ____________ Start time: ____________ Doreatha Martin time: ____________ Date: ____________ Movements: ____________ Start time: ____________ Doreatha Martin time: ____________ Date: ____________ Movements: ____________ Start time: ____________ Doreatha Martin time: ____________ Date: ____________ Movements: ____________ Start time: ____________ Doreatha Martin time: ____________ Date: ____________ Movements: ____________ Start time: ____________ Doreatha Martin time: ____________ Date: ____________ Movements: ____________ Start time: ____________ Doreatha Martin time: ____________ Date: ____________ Movements: ____________ Start time: ____________ Doreatha Martin time: ____________  Date: ____________ Movements: ____________ Start time: ____________ Doreatha Martin time: ____________ Date: ____________ Movements: ____________ Start time: ____________ Doreatha Martin time: ____________ Date:  ____________ Movements: ____________ Start time: ____________ Doreatha Martin time: ____________ Date: ____________ Movements: ____________ Start time: ____________ Doreatha Martin time: ____________ Date: ____________ Movements: ____________ Start time: ____________ Doreatha Martin time: ____________ Date: ____________ Movements: ____________ Start time: ____________ Doreatha Martin time: ____________ Date: ____________ Movements: ____________ Start time: ____________ Doreatha Martin time: ____________  Date: ____________ Movements: ____________ Start time: ____________ Doreatha Martin time: ____________ Date: ____________ Movements: ____________ Start time: ____________ Doreatha Martin time: ____________ Date: ____________ Movements: ____________ Start time: ____________ Doreatha Martin time: ____________ Date: ____________ Movements: ____________ Start time: ____________ Doreatha Martin time: ____________ Date: ____________ Movements: ____________ Start time: ____________ Doreatha Martin time: ____________ Date: ____________ Movements: ____________ Start time: ____________ Doreatha Martin time: ____________ Date: ____________ Movements: ____________ Start time: ____________ Doreatha Martin time: ____________  Date: ____________ Movements: ____________ Start time: ____________ Doreatha Martin time: ____________ Date: ____________ Movements: ____________ Start time: ____________ Doreatha Martin time: ____________ Date: ____________ Movements: ____________ Start time: ____________ Doreatha Martin time: ____________ Date: ____________ Movements: ____________ Start time: ____________ Doreatha Martin time: ____________ Date: ____________ Movements: ____________ Start time: ____________ Doreatha Martin time: ____________ Date:  ____________ Movements: ____________ Start time: ____________ Doreatha Martin time: ____________ Date: ____________ Movements: ____________ Start time: ____________ Doreatha Martin time: ____________  Date: ____________ Movements: ____________ Start time: ____________ Doreatha Martin time: ____________ Date: ____________ Movements: ____________ Start  time: ____________ Doreatha Martin time: ____________ Date: ____________ Movements: ____________ Start time: ____________ Doreatha Martin time: ____________ Date: ____________ Movements: ____________ Start time: ____________ Doreatha Martin time: ____________ Date: ____________ Movements: ____________ Start time: ____________ Doreatha Martin time: ____________ Date: ____________ Movements: ____________ Start time: ____________ Doreatha Martin time: ____________ Date: ____________ Movements: ____________ Start time: ____________ Doreatha Martin time: ____________  Date: ____________ Movements: ____________ Start time: ____________ Doreatha Martin time: ____________ Date: ____________ Movements: ____________ Start time: ____________ Doreatha Martin time: ____________ Date: ____________ Movements: ____________ Start time: ____________ Doreatha Martin time: ____________ Date: ____________ Movements: ____________ Start time: ____________ Doreatha Martin time: ____________ Date: ____________ Movements: ____________ Start time: ____________ Doreatha Martin time: ____________ Date: ____________ Movements: ____________ Start time: ____________ Doreatha Martin time: ____________ Date: ____________ Movements: ____________ Start time: ____________ Doreatha Martin time: ____________  Date: ____________ Movements: ____________ Start time: ____________ Doreatha Martin time: ____________ Date: ____________ Movements: ____________ Start time: ____________ Doreatha Martin time: ____________ Date: ____________ Movements: ____________ Start time: ____________ Doreatha Martin time: ____________ Date: ____________ Movements: ____________ Start time: ____________ Doreatha Martin time: ____________ Date: ____________ Movements: ____________ Start time: ____________ Doreatha Martin time: ____________ Date: ____________ Movements: ____________ Start time: ____________ Doreatha Martin time: ____________ Date: ____________ Movements: ____________ Start time: ____________ Doreatha Martin time: ____________  Date: ____________ Movements: ____________ Start time: ____________ Doreatha Martin time:  ____________ Date: ____________ Movements: ____________ Start time: ____________ Doreatha Martin time: ____________ Date: ____________ Movements: ____________ Start time: ____________ Doreatha Martin time: ____________ Date: ____________ Movements: ____________ Start time: ____________ Doreatha Martin time: ____________ Date: ____________ Movements: ____________ Start time: ____________ Doreatha Martin time: ____________ Date: ____________ Movements: ____________ Start time: ____________ Doreatha Martin time: ____________ Document Released: 11/21/2006 Document Revised: 03/08/2014 Document Reviewed: 08/18/2012 [employer] Patient Information 2015 [employer]. This information is not intended to replace advice given to you by your health care provider. Make sure you discuss any questions you have with your health care provider.

## 2015-02-28 ENCOUNTER — Inpatient Hospital Stay (HOSPITAL_COMMUNITY)
Admission: AD | Admit: 2015-02-28 | Payer: BLUE CROSS/BLUE SHIELD | Source: Ambulatory Visit | Admitting: Obstetrics and Gynecology

## 2015-02-28 ENCOUNTER — Encounter (HOSPITAL_COMMUNITY): Payer: Self-pay | Admitting: *Deleted

## 2015-02-28 ENCOUNTER — Inpatient Hospital Stay (HOSPITAL_COMMUNITY)
Admission: AD | Admit: 2015-02-28 | Discharge: 2015-03-03 | DRG: 774 | Disposition: A | Discharge status: Home / Self Care | Payer: BLUE CROSS/BLUE SHIELD | Source: Ambulatory Visit | Attending: Obstetrics and Gynecology | Admitting: Obstetrics and Gynecology

## 2015-02-28 DIAGNOSIS — O133 Gestational [pregnancy-induced] hypertension without significant proteinuria, third trimester: Secondary | ICD-10-CM | POA: Diagnosis not present

## 2015-02-28 DIAGNOSIS — Z3A37 37 weeks gestation of pregnancy: Secondary | ICD-10-CM | POA: Diagnosis present

## 2015-02-28 DIAGNOSIS — O1493 Unspecified pre-eclampsia, third trimester: Secondary | ICD-10-CM | POA: Diagnosis present

## 2015-02-28 DIAGNOSIS — Z3483 Encounter for supervision of other normal pregnancy, third trimester: Secondary | ICD-10-CM | POA: Diagnosis present

## 2015-02-28 HISTORY — DX: Female infertility, unspecified: N97.9

## 2015-02-28 HISTORY — DX: Breast implant status: Z98.82

## 2015-02-28 LAB — COMPREHENSIVE METABOLIC PANEL
ALT: 14 U/L (ref 0–35)
AST: 24 U/L (ref 0–37)
Albumin: 3.1 g/dL — ABNORMAL LOW (ref 3.5–5.2)
Alkaline Phosphatase: 166 U/L — ABNORMAL HIGH (ref 39–117)
Anion gap: 9 (ref 5–15)
BILIRUBIN TOTAL: 0.3 mg/dL (ref 0.3–1.2)
BUN: 8 mg/dL (ref 6–23)
CO2: 21 mmol/L (ref 19–32)
CREATININE: 0.47 mg/dL — AB (ref 0.50–1.10)
Calcium: 9.4 mg/dL (ref 8.4–10.5)
Chloride: 105 mmol/L (ref 96–112)
GFR calc Af Amer: >90 mL/min (ref >90)
Glucose, Bld: 69 mg/dL — ABNORMAL LOW (ref 70–99)
Potassium: 3.8 mmol/L (ref 3.5–5.1)
SODIUM: 135 mmol/L (ref 135–145)
Total Protein: 6.6 g/dL (ref 6.0–8.3)

## 2015-02-28 LAB — TYPE AND SCREEN
ABO/RH(D): O POS
ANTIBODY SCREEN: NEGATIVE

## 2015-02-28 LAB — PROTEIN / CREATININE RATIO, URINE
Creatinine, Urine: 99 mg/dL
PROTEIN CREATININE RATIO: 0.27 — AB (ref 0.00–0.15)
Total Protein, Urine: 27 mg/dL

## 2015-02-28 LAB — CBC
HEMATOCRIT: 36.1 % (ref 36.0–46.0)
Hemoglobin: 12.4 g/dL (ref 12.0–15.0)
MCH: 31.7 pg (ref 26.0–34.0)
MCHC: 34.3 g/dL (ref 30.0–36.0)
MCV: 92.3 fL (ref 78.0–100.0)
PLATELETS: 213 10*3/uL (ref 150–400)
RBC: 3.91 MIL/uL (ref 3.87–5.11)
RDW: 13.9 % (ref 11.5–15.5)
WBC: 10.1 10*3/uL (ref 4.0–10.5)

## 2015-02-28 LAB — URINALYSIS, ROUTINE W REFLEX MICROSCOPIC
BILIRUBIN URINE: NEGATIVE
Glucose, UA: NEGATIVE mg/dL
Ketones, ur: NEGATIVE mg/dL
Nitrite: NEGATIVE
PH: 6 (ref 5.0–8.0)
Protein, ur: NEGATIVE mg/dL
SPECIFIC GRAVITY, URINE: 1.015 (ref 1.005–1.030)
Urobilinogen, UA: 0.2 mg/dL (ref 0.0–1.0)

## 2015-02-28 LAB — URINE MICROSCOPIC-ADD ON

## 2015-02-28 MED ORDER — FENTANYL 2.5 MCG/ML BUPIVACAINE 1/10 % EPIDURAL INFUSION (WH - ANES)
14.0000 mL/h | INTRAMUSCULAR | Status: DC | PRN
  Administered 2015-03-01 (×2): 14 mL/h via EPIDURAL

## 2015-02-28 MED ORDER — OXYTOCIN 40 UNITS IN LACTATED RINGERS INFUSION - SIMPLE MED
62.5000 mL/h | INTRAVENOUS | Status: DC
  Filled 2015-02-28: qty 1000

## 2015-02-28 MED ORDER — EPHEDRINE 5 MG/ML INJ
10.0000 mg | INTRAVENOUS | Status: DC | PRN
  Filled 2015-02-28: qty 2

## 2015-02-28 MED ORDER — PHENYLEPHRINE 40 MCG/ML (10ML) SYRINGE FOR IV PUSH (FOR BLOOD PRESSURE SUPPORT)
80.0000 ug | PREFILLED_SYRINGE | INTRAVENOUS | Status: DC | PRN
  Filled 2015-02-28: qty 2

## 2015-02-28 MED ORDER — LIDOCAINE HCL (PF) 1 % IJ SOLN
30.0000 mL | INTRAMUSCULAR | Status: DC | PRN
  Filled 2015-02-28: qty 30

## 2015-02-28 MED ORDER — LACTATED RINGERS IV SOLN: 500.0000 mL | INTRAVENOUS | Status: DC | PRN

## 2015-02-28 MED ORDER — LACTATED RINGERS IV SOLN
500.0000 mL | Freq: Once | INTRAVENOUS | Status: AC
  Administered 2015-03-01: 500 mL via INTRAVENOUS

## 2015-02-28 MED ORDER — LACTATED RINGERS IV SOLN: INTRAVENOUS | Status: DC

## 2015-02-28 MED ORDER — ONDANSETRON HCL 4 MG/2ML IJ SOLN: 4.0000 mg | Freq: Four times a day (QID) | INTRAMUSCULAR | Status: DC | PRN

## 2015-02-28 MED ORDER — OXYTOCIN BOLUS FROM INFUSION
500.0000 mL | INTRAVENOUS | Status: DC
  Administered 2015-03-01: 500 mL via INTRAVENOUS

## 2015-02-28 MED ORDER — TERBUTALINE SULFATE 1 MG/ML IJ SOLN: 0.2500 mg | Freq: Once | INTRAMUSCULAR | Status: AC | PRN

## 2015-02-28 MED ORDER — MISOPROSTOL 25 MCG QUARTER TABLET
25.0000 ug | ORAL_TABLET | ORAL | Status: DC | PRN
  Administered 2015-02-28 (×2): 25 ug via VAGINAL
  Filled 2015-02-28 (×2): qty 0.25
  Filled 2015-02-28: qty 1

## 2015-02-28 MED ORDER — CITRIC ACID-SODIUM CITRATE 334-500 MG/5ML PO SOLN: 30.0000 mL | ORAL | Status: DC | PRN

## 2015-02-28 MED ORDER — DIPHENHYDRAMINE HCL 50 MG/ML IJ SOLN: 12.5000 mg | INTRAMUSCULAR | Status: DC | PRN

## 2015-02-28 NOTE — H&P (Signed)
Tiffany ReadingKelli L Yearby is a 34 y.o. female presenting to office at 37 weeks.  Blood pressure 154/98 with 1 + protienuria.  In MAU   bp's still elevated admitted for induction.  GBS negative. Maternal Medical History:  Prenatal complications: Pre-eclampsia.   Prenatal Complications - Diabetes: none.    OB History    Gravida Para Term Preterm AB TAB SAB Ectopic Multiple Living   2 0 0 0 1 0 1 0 0 0      Past Medical History  Diagnosis Date  . Melasma 2012  . Complication of anesthesia     stated was hard to wake up p anesthesia 2008  . Infertility, female    Past Surgical History  Procedure Laterality Date  . Breast surgery      Augmentation  . Dilation and evacuation N/A 02/11/2014    Procedure: DILATATION AND EVACUATION;  Surgeon: Annamaria BootsMary Suzanne Miller, MD;  Location: [facility];  Service: Gynecology;  Laterality: N/A;   Family History: family history includes Cancer - Lung in her paternal grandfather. Social History:  reports that she has never smoked. She has never used smokeless tobacco. She reports that she does not drink alcohol or use illicit drugs.   Prenatal Transfer Tool  Maternal Diabetes: No Genetic Screening: Normal Maternal Ultrasounds/Referrals: Normal Fetal Ultrasounds or other Referrals:  Referred to Materal Fetal Medicine question of vasa previa not seen on sono with MFM M Maternal Substance Abuse:  No Significant Maternal Medications:  None Significant Maternal Lab Results:  None Other Comments:  None  ROS    Blood pressure 144/90, pulse 104, temperature 98.3 F (36.8 C), temperature source Oral, resp. rate 18, height 5\' 4"  (1.626 m), weight 164 lb (74.39 kg), SpO2 98 %, unknown if currently breastfeeding. Maternal Exam:  Uterine Assessment: Contraction strength is mild.  Contraction frequency is irregular.   Abdomen: Patient reports no abdominal tenderness. Fundal height is c/w dates.   Fetal presentation: vertex  Introitus: Amniotic fluid character: not  assessed.  Pelvis: adequate for delivery.   Cervix: 1 cm and 80 %  Fetal Exam Fetal State Assessment: Category I - tracings are normal.     Physical Exam  Constitutional: She is oriented to person, place, and time.  Cardiovascular: Normal rate, regular rhythm and normal heart sounds.   Respiratory: Effort normal and breath sounds normal.  GI:  Gravid uterus c/w dates  Neurological: She is alert and oriented to person, place, and time. She displays abnormal reflex.    Prenatal labs: ABO, Rh:   Antibody:   Rubella:   RPR:    HBsAg:    HIV:    GBS:     Assessment/Plan: IUP at 37 weeks with pre eclampsia Induction with cytotec risks dissscued   Saivion Goettel S 02/28/2015, 4:26 PM

## 2015-02-28 NOTE — MAU Note (Signed)
Patient states she is here for induction due to elevated BP. BS not aware of patient to be admitted. Called Dr. Arelia SneddonMcComb and he states he would like to assess patient further in MAU prior to making final decision to induce. Patient and husband informed.

## 2015-02-28 NOTE — MAU Provider Note (Signed)
History     CSN: [id number]  Arrival date and time: 02/28/15 1314   First Provider Initiated Contact with Patient 02/28/15 1358      Chief Complaint  Patient presents with  . Hypertension   HPI [name] is a 34 y.o. G2P0010 at [redacted]w[redacted]d who presents to MAU today from the office for elevated BP. The patient denies headache, blurred vision, floaters, RUQ pain or significant peripheral edema. She states intermittent edema that improves with rest. Patient was seen in MAU on 02/26/15 and had complete work-up for pre-eclampsia. Labs were negative. Patient states slight decreased in FM today. She states mild abdominal cramping since last night, unsure about contractions. She denies vaginal bleeding or LOF.   OB History    Gravida Para Term Preterm AB TAB SAB Ectopic Multiple Living        Past Medical History  Diagnosis Date  . Melasma 2012  . Complication of anesthesia     stated was hard to wake up p anesthesia 2008  . Infertility, female     Past Surgical History  Procedure Laterality Date  . Breast surgery      Augmentation  . Dilation and evacuation N/A 02/11/2014    Procedure: DILATATION AND EVACUATION;  Surgeon: Annamaria Boots, MD;  Location: [facility];  Service: Gynecology;  Laterality: N/A;    Family History  Problem Relation Age of Onset  . Cancer - Lung Paternal Grandfather     History  Substance Use Topics  . Smoking status: Never Smoker   . Smokeless tobacco: Never Used  . Alcohol Use: No    Allergies:  Allergies  Allergen Reactions  . Vicodin [Hydrocodone-Acetaminophen] Nausea And Vomiting    Prescriptions prior to admission  Medication Sig Dispense Refill Last Dose  . dextromethorphan (DELSYM) 30 MG/5ML liquid Take 60 mg by mouth at bedtime as needed for cough.   02/27/2015 at Unknown time  . Prenatal Vit-Fe Fumarate-FA (PRENATAL MULTIVITAMIN) TABS tablet Take 1 tablet by mouth daily at 12 noon.   02/28/2015 at Unknown time   . letrozole (FEMARA) 2.5 MG tablet 3 tablets (7.5mg  total) days 5-9 of cycle (Patient not taking: Reported on 10/22/2014) 15 tablet 0 Not Taking  . oxyCODONE-acetaminophen (PERCOCET) 5-325 MG per tablet Take 1-2 tablets by mouth every 4 (four) hours as needed. use only as much as needed to relieve pain (Patient not taking: Reported on 10/22/2014) 30 tablet 0 Not Taking    Review of Systems  Constitutional: Negative for fever.  Eyes: Negative for blurred vision.       Neg - floaters  Gastrointestinal: Negative for abdominal pain.  Neurological: Negative for headaches.   Physical Exam   Blood pressure 126/84, pulse 107, unknown if currently breastfeeding.  Physical Exam  Constitutional: She is oriented to person, place, and time. She appears well-developed and well-nourished. No distress.  HENT:  Head: Normocephalic.  Cardiovascular: Normal rate.   Respiratory: Effort normal.  GI: Soft. She exhibits no distension and no mass. There is tenderness (mild RUQ tenderness to palpation). There is no rebound and no guarding.  Musculoskeletal: She exhibits no edema.  Neurological: She is alert and oriented to person, place, and time. She has normal reflexes.  1 beat clonus on right  Skin: Skin is warm and dry. No erythema.  Psychiatric: She has a normal mood and affect.   Results for orders placed or performed during the hospital encounter of  02/28/15 (from the past 24 hour(s))  Urinalysis, Routine w reflex microscopic     Status: Abnormal   Collection Time: 02/28/15  1:40 PM  Result Value Ref Range   Color, Urine YELLOW YELLOW   APPearance CLEAR CLEAR   Specific Gravity, Urine 1.015 1.005 - 1.030   pH 6.0 5.0 - 8.0   Glucose, UA NEGATIVE NEGATIVE mg/dL   Hgb urine dipstick TRACE (A) NEGATIVE   Bilirubin Urine NEGATIVE NEGATIVE   Ketones, ur NEGATIVE NEGATIVE mg/dL   Protein, ur NEGATIVE NEGATIVE mg/dL   Urobilinogen, UA 0.2 0.0 - 1.0 mg/dL   Nitrite NEGATIVE NEGATIVE   Leukocytes,  UA MODERATE (A) NEGATIVE  Protein / creatinine ratio, urine     Status: Abnormal   Collection Time: 02/28/15  1:40 PM  Result Value Ref Range   Creatinine, Urine 99.00 mg/dL   Total Protein, Urine 27 mg/dL   Protein Creatinine Ratio 0.27 (H) 0.00 - 0.15  Urine microscopic-add on     Status: Abnormal   Collection Time: 02/28/15  1:40 PM  Result Value Ref Range   Squamous Epithelial / LPF FEW (A) RARE   WBC, UA 7-10 <3 WBC/hpf   RBC / HPF 0-2 <3 RBC/hpf   Bacteria, UA MANY (A) RARE  CBC     Status: None   Collection Time: 02/28/15  2:05 PM  Result Value Ref Range   WBC 10.1 4.0 - 10.5 K/uL   RBC 3.91 3.87 - 5.11 MIL/uL   Hemoglobin 12.4 12.0 - 15.0 g/dL   HCT 11.936.1 14.736.0 - 82.946.0 %   MCV 92.3 78.0 - 100.0 fL   MCH 31.7 26.0 - 34.0 pg   MCHC 34.3 30.0 - 36.0 g/dL   RDW 56.213.9 13.011.5 - 86.515.5 %   Platelets 213 150 - 400 K/uL  Comprehensive metabolic panel     Status: Abnormal   Collection Time: 02/28/15  2:05 PM  Result Value Ref Range   Sodium 135 135 - 145 mmol/L   Potassium 3.8 3.5 - 5.1 mmol/L   Chloride 105 96 - 112 mmol/L   CO2 21 19 - 32 mmol/L   Glucose, Bld 69 (L) 70 - 99 mg/dL   BUN 8 6 - 23 mg/dL   Creatinine, Ser 7.840.47 (L) 0.50 - 1.10 mg/dL   Calcium 9.4 8.4 - 69.610.5 mg/dL   Total Protein 6.6 6.0 - 8.3 g/dL   Albumin 3.1 (L) 3.5 - 5.2 g/dL   AST 24 0 - 37 U/L   ALT 14 0 - 35 U/L   Alkaline Phosphatase 166 (H) 39 - 117 U/L   Total Bilirubin 0.3 0.3 - 1.2 mg/dL   GFR calc non Af Amer >90 >90 mL/min   GFR calc Af Amer >90 >90 mL/min   Anion gap 9 5 - 15    Fetal Monitoring: Baseline: 130 bpm, moderate variability, + accelerations, no decelerations Contractions: irregular with moderate UI  Patient Vitals for the past 24 hrs:  BP Pulse  02/28/15 1452 126/84 mmHg 107  02/28/15 1442 (!) 140/109 mmHg 110  02/28/15 1432 145/97 mmHg 104  02/28/15 1422 143/90 mmHg 115  02/28/15 1412 137/94 mmHg 107  02/28/15 1402 146/99 mmHg 105  02/28/15 1359 144/96 mmHg 100  02/28/15  1352 (!) 159/111 mmHg 103    MAU Course  Procedures None  MDM CBC, CMP, Uric acid, LDH and urine protein/creatinine ratio ordered Serial BPs while in MAU Discussed patient with Dr. Arelia SneddonMcComb. Admit to L&D for induction. Orders given to  RN for admission.   Assessment and Plan  A: SIUP at [redacted]w[redacted]d Gestation HTN  P: Admit to L&D for induction  Marny Lowenstein, PA-C  02/28/2015, 2:52 PM

## 2015-03-01 ENCOUNTER — Encounter (HOSPITAL_COMMUNITY): Payer: Self-pay | Admitting: *Deleted

## 2015-03-01 ENCOUNTER — Inpatient Hospital Stay (HOSPITAL_COMMUNITY): Payer: BLUE CROSS/BLUE SHIELD | Admitting: Anesthesiology

## 2015-03-01 LAB — CBC
HCT: 35.4 % — ABNORMAL LOW (ref 36.0–46.0)
HCT: 36.3 % (ref 36.0–46.0)
Hemoglobin: 12 g/dL (ref 12.0–15.0)
Hemoglobin: 12.5 g/dL (ref 12.0–15.0)
MCH: 31.5 pg (ref 26.0–34.0)
MCH: 32 pg (ref 26.0–34.0)
MCHC: 33.9 g/dL (ref 30.0–36.0)
MCHC: 34.4 g/dL (ref 30.0–36.0)
MCV: 92.9 fL (ref 78.0–100.0)
MCV: 92.8 fL (ref 78.0–100.0)
PLATELETS: 186 10*3/uL (ref 150–400)
Platelets: 173 10*3/uL (ref 150–400)
RBC: 3.81 MIL/uL — ABNORMAL LOW (ref 3.87–5.11)
RBC: 3.91 MIL/uL (ref 3.87–5.11)
RDW: 13.7 % (ref 11.5–15.5)
RDW: 13.7 % (ref 11.5–15.5)
WBC: 12.7 10*3/uL — AB (ref 4.0–10.5)
WBC: 16.4 10*3/uL — ABNORMAL HIGH (ref 4.0–10.5)

## 2015-03-01 LAB — RPR: RPR Ser Ql: NONREACTIVE

## 2015-03-01 MED ORDER — BENZOCAINE-MENTHOL 20-0.5 % EX AERO: 1.0000 "application " | INHALATION_SPRAY | CUTANEOUS | Status: DC | PRN

## 2015-03-01 MED ORDER — MEASLES, MUMPS & RUBELLA VAC ~~LOC~~ INJ
0.5000 mL | INJECTION | Freq: Once | SUBCUTANEOUS | Status: DC
  Filled 2015-03-01: qty 0.5

## 2015-03-01 MED ORDER — OXYCODONE-ACETAMINOPHEN 5-325 MG PO TABS: 1.0000 | ORAL_TABLET | ORAL | Status: DC | PRN

## 2015-03-01 MED ORDER — LANOLIN HYDROUS EX OINT: TOPICAL_OINTMENT | CUTANEOUS | Status: DC | PRN

## 2015-03-01 MED ORDER — LIDOCAINE HCL (PF) 1 % IJ SOLN
INTRAMUSCULAR | Status: DC | PRN
  Administered 2015-03-01: 9 mL
  Administered 2015-03-01: 8 mL

## 2015-03-01 MED ORDER — ONDANSETRON HCL 4 MG/2ML IJ SOLN: 4.0000 mg | INTRAMUSCULAR | Status: DC | PRN

## 2015-03-01 MED ORDER — TETANUS-DIPHTH-ACELL PERTUSSIS 5-2.5-18.5 LF-MCG/0.5 IM SUSP: 0.5000 mL | Freq: Once | INTRAMUSCULAR | Status: DC

## 2015-03-01 MED ORDER — SENNOSIDES-DOCUSATE SODIUM 8.6-50 MG PO TABS
2.0000 | ORAL_TABLET | ORAL | Status: DC
  Administered 2015-03-01 – 2015-03-02 (×2): 2 via ORAL
  Filled 2015-03-01 (×2): qty 2

## 2015-03-01 MED ORDER — MEDROXYPROGESTERONE ACETATE 150 MG/ML IM SUSP: 150.0000 mg | INTRAMUSCULAR | Status: DC | PRN

## 2015-03-01 MED ORDER — FENTANYL 2.5 MCG/ML BUPIVACAINE 1/10 % EPIDURAL INFUSION (WH - ANES)
INTRAMUSCULAR | Status: AC
  Administered 2015-03-01: 14 mL/h via EPIDURAL
  Filled 2015-03-01: qty 125

## 2015-03-01 MED ORDER — PROMETHAZINE HCL 25 MG/ML IJ SOLN
12.5000 mg | Freq: Once | INTRAMUSCULAR | Status: AC
  Administered 2015-03-01: 12.5 mg via INTRAVENOUS
  Filled 2015-03-01: qty 1

## 2015-03-01 MED ORDER — OXYTOCIN 40 UNITS IN LACTATED RINGERS INFUSION - SIMPLE MED
1.0000 m[IU]/min | INTRAVENOUS | Status: DC
Start: 2015-03-01 — End: 2015-03-01
  Administered 2015-03-01: 2 m[IU]/min via INTRAVENOUS

## 2015-03-01 MED ORDER — ONDANSETRON HCL 4 MG PO TABS: 4.0000 mg | ORAL_TABLET | ORAL | Status: DC | PRN

## 2015-03-01 MED ORDER — DIPHENHYDRAMINE HCL 25 MG PO CAPS: 25.0000 mg | ORAL_CAPSULE | Freq: Four times a day (QID) | ORAL | Status: DC | PRN

## 2015-03-01 MED ORDER — BUTORPHANOL TARTRATE 1 MG/ML IJ SOLN
1.0000 mg | Freq: Once | INTRAMUSCULAR | Status: AC
Start: 2015-03-01 — End: 2015-03-01
  Administered 2015-03-01: 1 mg via INTRAVENOUS
  Filled 2015-03-01: qty 1

## 2015-03-01 MED ORDER — TERBUTALINE SULFATE 1 MG/ML IJ SOLN
0.2500 mg | Freq: Once | INTRAMUSCULAR | Status: DC | PRN
  Filled 2015-03-01: qty 1

## 2015-03-01 MED ORDER — ZOLPIDEM TARTRATE 5 MG PO TABS
5.0000 mg | ORAL_TABLET | Freq: Every evening | ORAL | Status: DC | PRN
Start: 2015-03-01 — End: 2015-03-03

## 2015-03-01 MED ORDER — OXYCODONE-ACETAMINOPHEN 5-325 MG PO TABS: 2.0000 | ORAL_TABLET | ORAL | Status: DC | PRN

## 2015-03-01 MED ORDER — WITCH HAZEL-GLYCERIN EX PADS: 1.0000 "application " | MEDICATED_PAD | CUTANEOUS | Status: DC | PRN

## 2015-03-01 MED ORDER — ACETAMINOPHEN 325 MG PO TABS
650.0000 mg | ORAL_TABLET | ORAL | Status: DC | PRN
  Administered 2015-03-01: 650 mg via ORAL
  Filled 2015-03-01: qty 2

## 2015-03-01 MED ORDER — SIMETHICONE 80 MG PO CHEW: 80.0000 mg | CHEWABLE_TABLET | ORAL | Status: DC | PRN

## 2015-03-01 MED ORDER — PHENYLEPHRINE 40 MCG/ML (10ML) SYRINGE FOR IV PUSH (FOR BLOOD PRESSURE SUPPORT)
PREFILLED_SYRINGE | INTRAVENOUS | Status: AC
  Filled 2015-03-01: qty 20

## 2015-03-01 MED ORDER — IBUPROFEN 600 MG PO TABS
600.0000 mg | ORAL_TABLET | Freq: Four times a day (QID) | ORAL | Status: DC
  Administered 2015-03-01 – 2015-03-03 (×7): 600 mg via ORAL
  Filled 2015-03-01 (×7): qty 1

## 2015-03-01 MED ORDER — PRENATAL MULTIVITAMIN CH
1.0000 | ORAL_TABLET | Freq: Every day | ORAL | Status: DC
  Administered 2015-03-02: 1 via ORAL
  Filled 2015-03-01: qty 1

## 2015-03-01 MED ORDER — DIBUCAINE 1 % RE OINT: 1.0000 "application " | TOPICAL_OINTMENT | RECTAL | Status: DC | PRN

## 2015-03-01 NOTE — Progress Notes (Signed)
Pt BP's staying in the upper 140's/150's after transfer to floor. Pt is asymptomatic at present time. MD was made aware of the high BP's. Wants RN to watch BPs. If pt becomes symptomatic or BP's >160/100 notify MD.   Joyce CopaAbby W Masie Bermingham, RN

## 2015-03-01 NOTE — Progress Notes (Signed)
SVD of vigorous female infant w/ apgars of 9,9.  Placenta delivered manually - not intact.   Manual exploration of uterus after placental delivery and no retained POC noted   2nd degree lac repaired w/ 3-0 vicryl rapide.  Fundus firm.  EBL 250cc .

## 2015-03-01 NOTE — Anesthesia Procedure Notes (Signed)
Epidural Patient location during procedure: OB Start time: 03/01/2015 5:01 AM End time: 03/01/2015 5:05 AM  Staffing Anesthesiologist: Leilani AbleHATCHETT, Nazia Rhines Performed by: anesthesiologist   Preanesthetic Checklist Completed: patient identified, surgical consent, pre-op evaluation, timeout performed, IV checked, risks and benefits discussed and monitors and equipment checked  Epidural Patient position: sitting Prep: site prepped and draped and DuraPrep Patient monitoring: continuous pulse ox and blood pressure Approach: midline Location: L3-L4 Injection technique: LOR air  Needle:  Needle type: Tuohy  Needle gauge: 17 G Needle length: 9 cm and 9 Needle insertion depth: 5 cm cm Catheter type: closed end flexible Catheter size: 19 Gauge Catheter at skin depth: 10 cm Test dose: negative and Other  Assessment Sensory level: T9 Events: blood not aspirated, injection not painful, no injection resistance, negative IV test and no paresthesia  Additional Notes Reason for block:procedure for pain

## 2015-03-01 NOTE — Anesthesia Preprocedure Evaluation (Signed)

## 2015-03-01 NOTE — Progress Notes (Signed)
Pt comfortable w/ epidural  BP 120s/67 FHT reassuring, occ early decel Toco q2-4 Cvx c/c/+2  A/P:  Will start pushing soon

## 2015-03-02 LAB — CBC
HCT: 34.4 % — ABNORMAL LOW (ref 36.0–46.0)
Hemoglobin: 11.8 g/dL — ABNORMAL LOW (ref 12.0–15.0)
MCH: 31.9 pg (ref 26.0–34.0)
MCHC: 34.3 g/dL (ref 30.0–36.0)
MCV: 93 fL (ref 78.0–100.0)
PLATELETS: 186 10*3/uL (ref 150–400)
RBC: 3.7 MIL/uL — AB (ref 3.87–5.11)
RDW: 13.9 % (ref 11.5–15.5)
WBC: 14 10*3/uL — ABNORMAL HIGH (ref 4.0–10.5)

## 2015-03-02 NOTE — Progress Notes (Signed)
Attended 'Well After Birth' group class which presented discharge care information for both Mom and Baby. 

## 2015-03-02 NOTE — Progress Notes (Signed)
Post Partum Day 1 Subjective: no complaints, up ad lib, voiding, tolerating PO and Denies HA, blurred vision or RUQ pain  Objective: Blood pressure 151/91, pulse 69, temperature 97.5 F (36.4 C), temperature source Oral, resp. rate 20, height 5\' 4"  (1.626 m), weight 164 lb (74.39 kg), SpO2 98 %, unknown if currently breastfeeding.  Physical Exam:  General: alert and cooperative Lochia: appropriate Uterine Fundus: firm Incision: healing well DVT Evaluation: No evidence of DVT seen on physical exam. Negative Homan's sign. No cords or calf tenderness. No significant calf/ankle edema. DTR's 2+, no clonus   Recent Labs  03/01/15 1135 03/02/15 0550  HGB 12.5 11.8*  HCT 36.3 34.4*    Assessment/Plan: Plan for discharge tomorrow and Circumcision prior to discharge   LOS: 2 days   Landra Howze G 03/02/2015, 8:22 AM

## 2015-03-02 NOTE — Progress Notes (Signed)
BP 157/93.  Pt asymptomatic.  Spoke with Dr. Vincente PoliGrewal at 2007 and was told to keep watching the BP.  Will continue to check BP Q4 hours and call MD if >160/110 or if pt become symptomatic.

## 2015-03-02 NOTE — Anesthesia Postprocedure Evaluation (Signed)
  Anesthesia Post-op Note  Patient: Tiffany George  Procedure(s) Performed: * No procedures listed *  Patient Location: Mother/Baby  Anesthesia Type:Epidural  Level of Consciousness: awake, alert , oriented and patient cooperative  Airway and Oxygen Therapy: Patient Spontanous Breathing  Post-op Pain: mild  Post-op Assessment: Post-op Vital signs reviewed, Patient's Cardiovascular Status Stable, Respiratory Function Stable, Patent Airway, No signs of Nausea or vomiting, Adequate PO intake, Pain level controlled, No headache, No backache, No residual numbness and No residual motor weakness  Post-op Vital Signs: Reviewed and stable  Last Vitals:  Filed Vitals:   03/02/15 0802  BP: 151/91  Pulse: 69  Temp:   Resp:     Complications: No apparent anesthesia complications

## 2015-03-03 NOTE — Discharge Summary (Signed)
Obstetric Discharge Summary Reason for Admission: induction of labor Prenatal Procedures: ultrasound Intrapartum Procedures: spontaneous vaginal delivery Postpartum Procedures: none Complications-Operative and Postpartum: 2 degree perineal laceration HEMOGLOBIN  Date Value Ref Range Status  03/02/2015 11.8* 12.0 - 15.0 g/dL Final   HCT  Date Value Ref Range Status  03/02/2015 34.4* 36.0 - 46.0 % Final    Physical Exam:  General: alert and cooperative Lochia: appropriate Uterine Fundus: firm Incision: healing well DVT Evaluation: No evidence of DVT seen on physical exam. Negative Homan's sign. No cords or calf tenderness. No significant calf/ankle edema. DTR's 1+ No RUQ tenderness  Discharge Diagnoses: Term Pregnancy-delivered  Discharge Information: Date: 03/03/2015 Activity: pelvic rest Diet: routine Medications: PNV and Ibuprofen Condition: stable Instructions: refer to practice specific booklet Discharge to: home,Reviewed signs and symptoms of PIH. Patient to RTO in 1 week for bp check   Newborn Data: Live born female  Birth Weight: 6 lb 11.2 oz (3039 g) APGAR: 9, 9  Home with mother and Circ prior to discharge.  Gary Bultman G 03/03/2015, 8:21 AM

## 2015-03-03 NOTE — Lactation Note (Addendum)
This note was copied from the chart of Tiffany Winfred BurnKelli Eisel. Lactation Consultation Note Mom states baby has not fed in 5-6 hours and he has been sleepy since circumcision this AM.  She reports that the baby cluster fed during the night.  She feels latch and feeds are improving.  Assisted with placing baby skin to skin in football hold.  Mom can easily hand express colostrum.  Attempted waking techniques but baby sleeping and showing no cues.  Discussed with parents that due to baby born at 37 weeks, jaundice and 8 % weight loss that I recommend she start post pumping every 3 hours and give any EBM back to baby.  Mom pumped 1 ml which was syringe fed to baby. Parents will rent a pump prior to discharge.  Volume parameters given.  Encouraged to call lactation office for concerns or OP appointment as needed.  Baby will be discharged with phototherapy.  Discharge instructions reviewed including engorgement treatment.  Patient Name: Tiffany George YNWGN'FToday's Date: 03/03/2015 Reason for consult: Follow-up assessment;Hyperbilirubinemia   Maternal Data    Feeding Feeding Type: Breast Fed  LATCH Score/Interventions Latch: Too sleepy or reluctant, no latch achieved, no sucking elicited. Intervention(s): Skin to skin;Teach feeding cues;Waking techniques Intervention(s): Breast compression;Breast massage;Adjust position;Assist with latch  Audible Swallowing: None Intervention(s): Skin to skin;Hand expression  Type of Nipple: Everted at rest and after stimulation  Comfort (Breast/Nipple): Soft / non-tender     Hold (Positioning): Assistance needed to correctly position infant at breast and maintain latch. Intervention(s): Breastfeeding basics reviewed;Support Pillows;Skin to skin;Position options  LATCH Score: 5  Lactation Tools Discussed/Used     Consult Status Consult Status: Complete    Huston FoleyMOULDEN, Xzaria Teo S 03/03/2015, 11:42 AM

## 2015-04-18 ENCOUNTER — Other Ambulatory Visit: Payer: Self-pay | Admitting: Obstetrics and Gynecology

## 2015-04-19 LAB — CYTOLOGY - PAP

## 2015-11-07 IMAGING — US US OB DETAIL+14 WK
1 series · 12 of 28 positions shown · non-contrast
Comparison: none

[Series 1: us ob detail+14 wk · 0.23mm/px · 112 acquisitions, 12 frames shown]
[im 5/112]
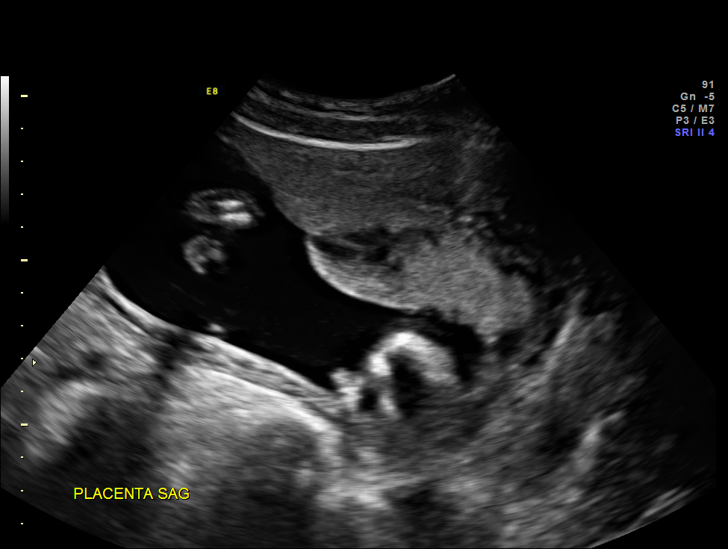
[im 13/112]
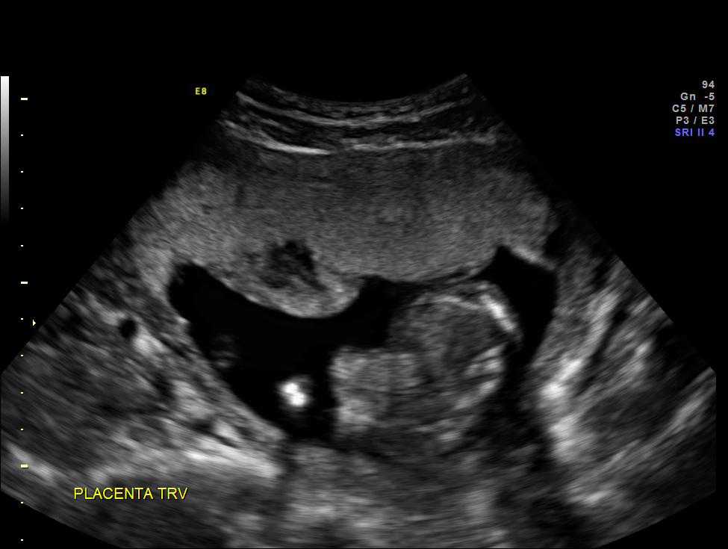
[im 21/112]
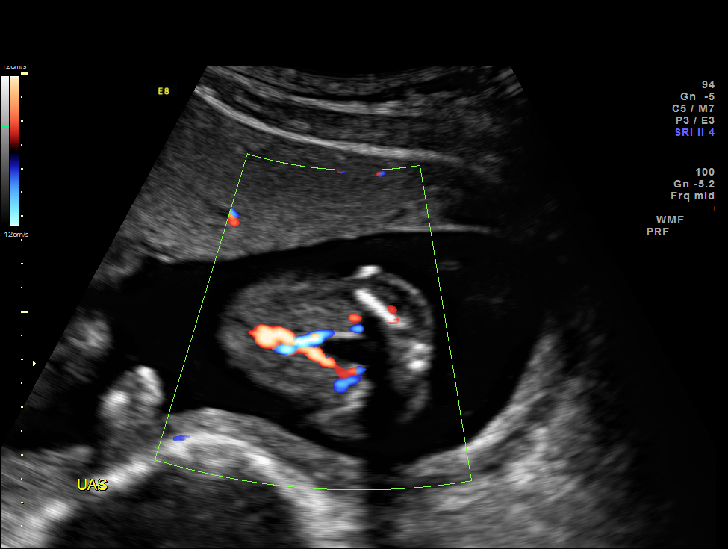
[im 33/112]
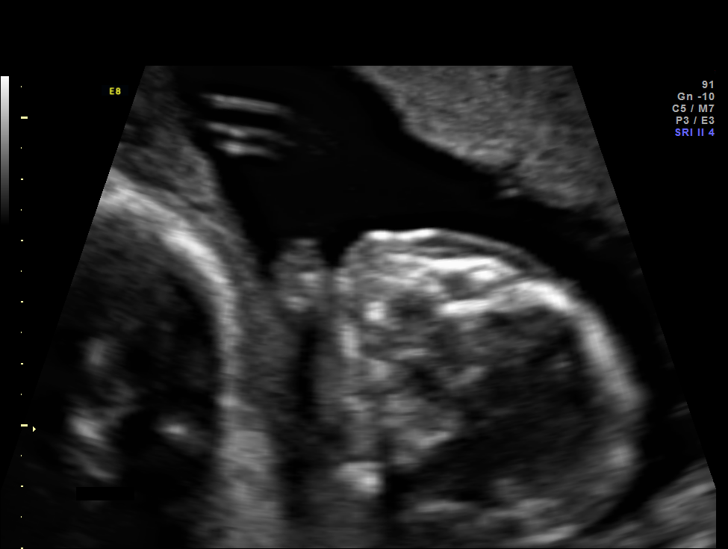
[im 42/112]
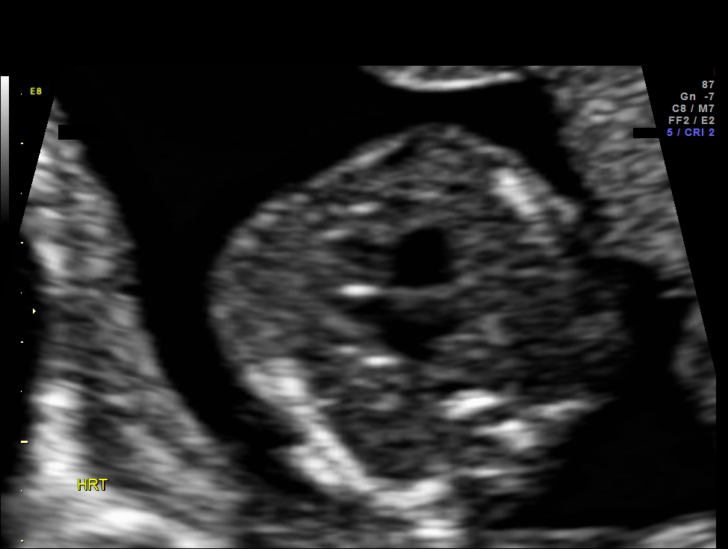
[im 50/112]
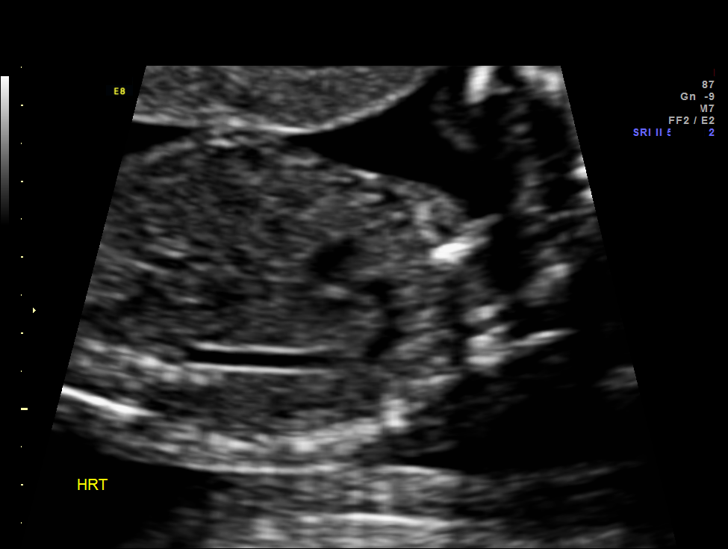
[im 62/112]
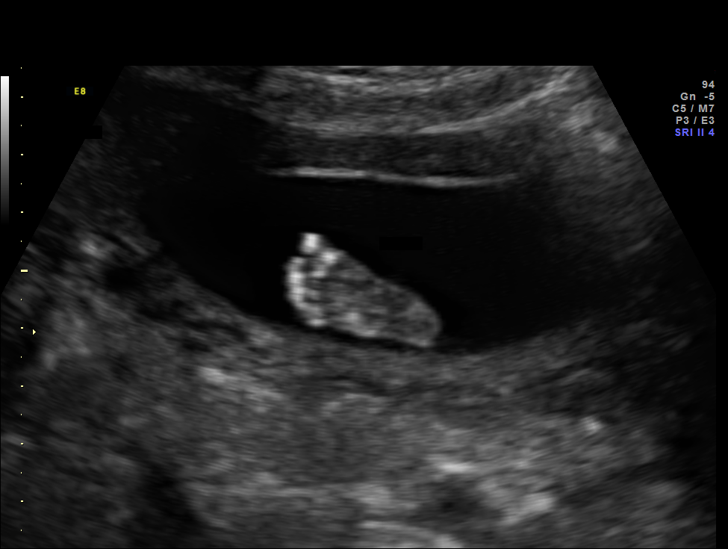
[im 70/112]
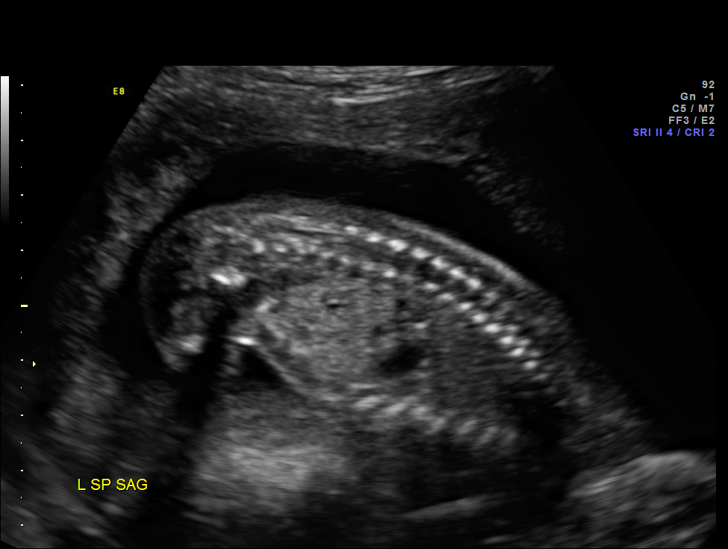
[im 79/112]
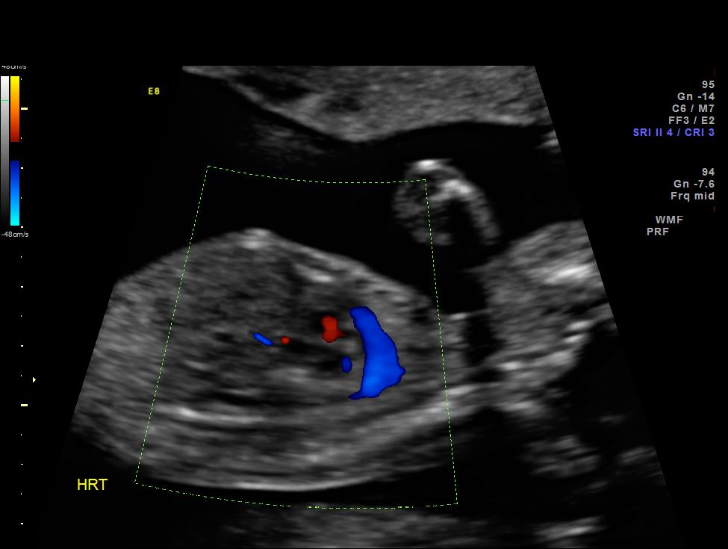
[im 91/112]
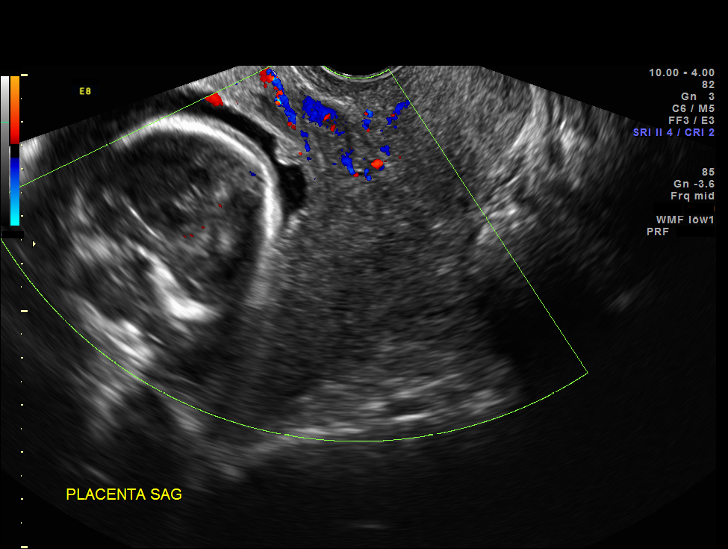
[im 99/112]
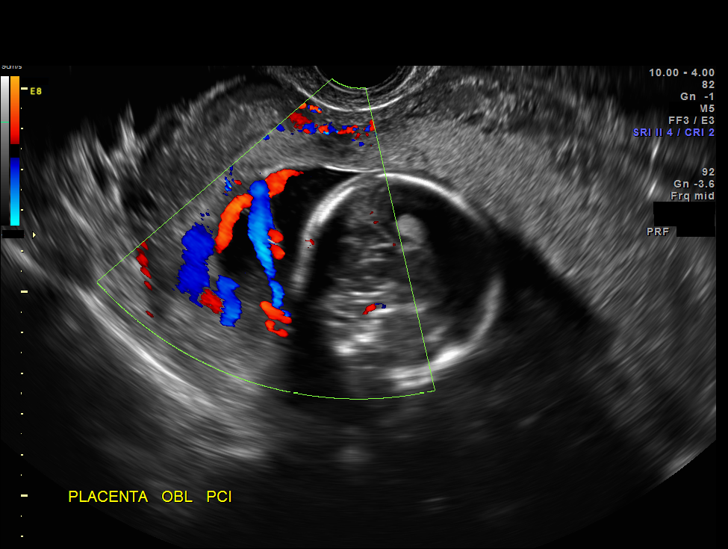
[im 107/112]
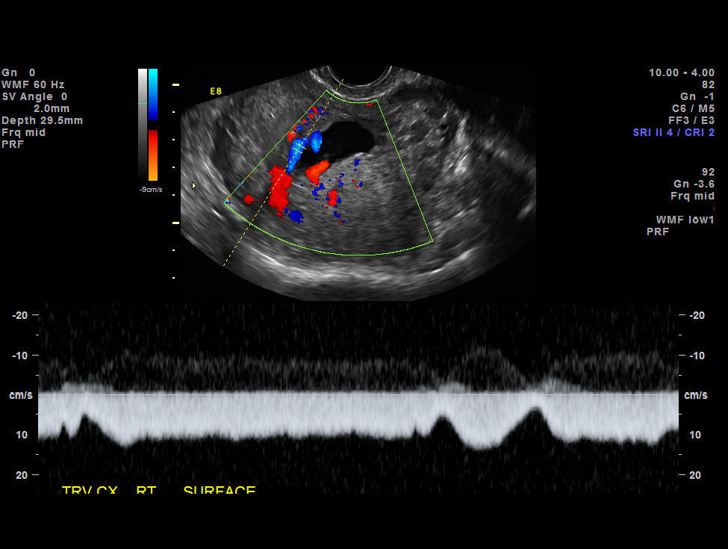

[12 of 28 positions shown; findings below may reference images not displayed]

OBSTETRICS REPORT
                      (Signed Final 10/22/2014 [DATE])

Service(s) Provided

 US OB DETAIL + 14 WK                                  76811.0
 US MFM OB TRANSVAGINAL                                76817.2
Indications

 Detailed fetal anatomic survey                        Z36
 Vasa previa
 18 weeks gestation of pregnancy
Fetal Evaluation

 Num Of Fetuses:    1
 Fetal Heart Rate:  148                          bpm
 Cardiac Activity:  Observed
 Presentation:      Cephalic
 Placenta:          Anterior
 P. Cord            Marginal PCI, Vasa
 Insertion:
                    previa

 Amniotic Fluid
 AFI FV:      Subjectively within normal limits
                                             Larg Pckt:     4.9  cm
Biometry

 BPD:     43.1  mm     G. Age:  19w 0d                CI:         77.9   70 - 86
 OFD:     55.3  mm                                    FL/HC:      17.3   16.1 -

 HC:     159.4  mm     G. Age:  18w 6d       53  %    HC/AC:      1.19   1.09 -

 AC:     133.8  mm     G. Age:  18w 6d       56  %    FL/BPD:
 FL:      27.6  mm     G. Age:  18w 3d       39  %    FL/AC:      20.6   20 - 24
 HUM:       26  mm     G. Age:  18w 1d       42  %
 CER:     18.7  mm     G. Age:  18w 2d       42  %

 Est. FW:     252  gm      0 lb 9 oz     48  %
Gestational Age

 U/S Today:     18w 6d                                        EDD:   03/19/15
 Best:          18w 4d     Det. By:  Early Ultrasound         EDD:   03/21/15
                                     (08/30/14)
Anatomy
 Cranium:          Appears normal         Aortic Arch:      Appears normal
 Fetal Cavum:      Appears normal         Ductal Arch:      Appears normal
 Ventricles:       Appears normal         Diaphragm:        Appears normal
 Choroid Plexus:   Appears normal         Stomach:          Appears normal, left
                                                            sided
 Cerebellum:       Appears normal         Abdomen:          Appears normal
 Posterior Fossa:  Appears normal         Abdominal Wall:   Appears nml (cord
                                                            insert, abd wall)
 Nuchal Fold:      Appears normal         Cord Vessels:     Appears normal (3
                                                            vessel cord)
 Face:             Appears normal         Kidneys:          Appear normal
                   (orbits and profile)
 Lips:             Appears normal         Bladder:          Appears normal
 Heart:            Appears normal         Spine:            Appears normal
                   (4CH, axis, and
                   situs)
 RVOT:             Appears normal         Lower             Appears normal
                                          Extremities:
 LVOT:             Appears normal         Upper             Appears normal
                                          Extremities:

 Other:  Fetus appears to be a male. Heels and 5th digit visualized. Nasal
         bone visualized.
Cervix Uterus Adnexa

 Cervical Length:    3.3      cm

 Cervix:       Measured transvaginally. Appears closed, without
               funnelling.
 Left Ovary:    Not visualized.
 Right Ovary:   Within normal limits.

 Adnexa:     No abnormality visualized.
Impression

 Single IUP at 18w 6d
 Normal fetal anatomic survey
 An anterior low lying placenta is noted.
 A marginal cord insertion is noted along the leading edge of
 the placenta.
 The umbilcal cord and fetal vessels course along the internal
 os - likely vasa previa and funic presention
 Ultrasound measurements consisent with dates
 Normal amniotic fluid volume
Recommendations

 See separate consult note.
 Recommend follow up ultrasound in 4 weeks to reevaluate
 the placenta / cord.
 If vasa previa persists, would consider admission at 30 weeks
 gestation for Rajwinder and observation, with planned
 delivery via C-section at 34 weeks or sooner if the clinical
 scenario presents.

 questions or concerns.

## 2017-06-04 ENCOUNTER — Inpatient Hospital Stay (HOSPITAL_COMMUNITY): Admission: AD | Admit: 2017-06-04 | Payer: Self-pay | Source: Ambulatory Visit | Admitting: Obstetrics and Gynecology

## 2017-10-18 ENCOUNTER — Inpatient Hospital Stay (HOSPITAL_COMMUNITY)
Admission: AD | Admit: 2017-10-18 | Discharge: 2017-10-19 | DRG: 833 | Disposition: A | Discharge status: Home / Self Care | Payer: BLUE CROSS/BLUE SHIELD | Source: Ambulatory Visit | Attending: Obstetrics and Gynecology | Admitting: Obstetrics and Gynecology

## 2017-10-18 ENCOUNTER — Encounter (HOSPITAL_COMMUNITY): Payer: Self-pay | Admitting: *Deleted

## 2017-10-18 ENCOUNTER — Inpatient Hospital Stay (HOSPITAL_COMMUNITY): Payer: BLUE CROSS/BLUE SHIELD

## 2017-10-18 ENCOUNTER — Other Ambulatory Visit: Payer: Self-pay

## 2017-10-18 DIAGNOSIS — O133 Gestational [pregnancy-induced] hypertension without significant proteinuria, third trimester: Secondary | ICD-10-CM | POA: Diagnosis present

## 2017-10-18 DIAGNOSIS — O30043 Twin pregnancy, dichorionic/diamniotic, third trimester: Secondary | ICD-10-CM | POA: Diagnosis present

## 2017-10-18 DIAGNOSIS — O1490 Unspecified pre-eclampsia, unspecified trimester: Secondary | ICD-10-CM

## 2017-10-18 DIAGNOSIS — Z3A32 32 weeks gestation of pregnancy: Secondary | ICD-10-CM

## 2017-10-18 DIAGNOSIS — O365932 Maternal care for other known or suspected poor fetal growth, third trimester, fetus 2: Secondary | ICD-10-CM | POA: Diagnosis present

## 2017-10-18 LAB — URIC ACID: Uric Acid, Serum: 4.4 mg/dL (ref 2.3–6.6)

## 2017-10-18 LAB — COMPREHENSIVE METABOLIC PANEL
ALBUMIN: 2.7 g/dL — AB (ref 3.5–5.0)
ALK PHOS: 157 U/L — AB (ref 38–126)
ALT: 12 U/L — AB (ref 14–54)
ANION GAP: 10 (ref 5–15)
AST: 22 U/L (ref 15–41)
BILIRUBIN TOTAL: 0.5 mg/dL (ref 0.3–1.2)
BUN: 9 mg/dL (ref 6–20)
CALCIUM: 8.7 mg/dL — AB (ref 8.9–10.3)
CO2: 20 mmol/L — AB (ref 22–32)
CREATININE: 0.56 mg/dL (ref 0.44–1.00)
Chloride: 106 mmol/L (ref 101–111)
GFR calc Af Amer: >60 mL/min (ref >60)
GFR calc non Af Amer: >60 mL/min (ref >60)
GLUCOSE: 70 mg/dL (ref 65–99)
Potassium: 3.7 mmol/L (ref 3.5–5.1)
SODIUM: 136 mmol/L (ref 135–145)
TOTAL PROTEIN: 6 g/dL — AB (ref 6.5–8.1)

## 2017-10-18 LAB — TYPE AND SCREEN
ABO/RH(D): O POS
Antibody Screen: NEGATIVE

## 2017-10-18 LAB — CBC
HCT: 31.5 % — ABNORMAL LOW (ref 36.0–46.0)
Hemoglobin: 9.9 g/dL — ABNORMAL LOW (ref 12.0–15.0)
MCH: 27.1 pg (ref 26.0–34.0)
MCHC: 31.4 g/dL (ref 30.0–36.0)
MCV: 86.3 fL (ref 78.0–100.0)
PLATELETS: 197 10*3/uL (ref 150–400)
RBC: 3.65 MIL/uL — AB (ref 3.87–5.11)
RDW: 14.7 % (ref 11.5–15.5)
WBC: 7.9 10*3/uL (ref 4.0–10.5)

## 2017-10-18 MED ORDER — PRENATAL MULTIVITAMIN CH
1.0000 | ORAL_TABLET | Freq: Every day | ORAL | Status: DC
  Administered 2017-10-19: 1 via ORAL
  Filled 2017-10-18: qty 1

## 2017-10-18 MED ORDER — DOCUSATE SODIUM 100 MG PO CAPS
100.0000 mg | ORAL_CAPSULE | Freq: Every day | ORAL | Status: DC
  Administered 2017-10-19: 100 mg via ORAL
  Filled 2017-10-18: qty 1

## 2017-10-18 MED ORDER — ZOLPIDEM TARTRATE 5 MG PO TABS: 5.0000 mg | ORAL_TABLET | Freq: Every evening | ORAL | Status: DC | PRN

## 2017-10-18 MED ORDER — BETAMETHASONE SOD PHOS & ACET 6 (3-3) MG/ML IJ SUSP
12.0000 mg | INTRAMUSCULAR | Status: AC
  Administered 2017-10-18 – 2017-10-19 (×2): 12 mg via INTRAMUSCULAR
  Filled 2017-10-18 (×2): qty 2

## 2017-10-18 MED ORDER — CALCIUM CARBONATE ANTACID 500 MG PO CHEW: 2.0000 | CHEWABLE_TABLET | ORAL | Status: DC | PRN

## 2017-10-18 MED ORDER — ACETAMINOPHEN 325 MG PO TABS
650.0000 mg | ORAL_TABLET | Freq: Four times a day (QID) | ORAL | Status: DC | PRN
  Administered 2017-10-18 – 2017-10-19 (×2): 650 mg via ORAL
  Filled 2017-10-18 (×2): qty 2

## 2017-10-18 NOTE — H&P (Signed)
Tiffany George is a 36 y.o. female presenting for admission for evaluation gestational HTN sxs and discordant twin fetal growth.  Pregnancy Di/Di twin conception from [place]. Began discordant growth noted at 25 weeks (85%A/37%B), 28 weeks US 84%A/29%B, and today 32 4/7 79%A/ 3%B.  They are Vtx/Breech.  They are female, female from NIPT. Today in office, besides the increasing discrepancy of weights, Tiffany George had elevated BPs 148/96 and 3+ proteinuria. OB History    Gravida Para Term Preterm AB Living   2 1 1  0 1 1   SAB TAB Ectopic Multiple Live Births   1 0 0 0 1     Past Medical History:  Diagnosis Date  . Complication of anesthesia    stated was hard to wake up p anesthesia 2008  . Hx of breast augmentation 2008  . Infertility, female   . Melasma 2012   Past Surgical History:  Procedure Laterality Date  . BREAST SURGERY     Augmentation  . DILATION AND EVACUATION N/A 02/11/2014   Procedure: DILATATION AND EVACUATION;  Surgeon: Annamaria BootsMary Suzanne Miller, MD;  Location: [facility];  Service: Gynecology;  Laterality: N/A;   Family History: family history includes Cancer - Lung in her paternal grandfather. Social History:  reports that  has never smoked. she has never used smokeless tobacco. She reports that she does not drink alcohol or use drugs.     Maternal Diabetes: No Genetic Screening: Normal Maternal Ultrasounds/Referrals: Normal Fetal Ultrasounds or other Referrals:  None Maternal Substance Abuse:  No Significant Maternal Medications:  None Significant Maternal Lab Results:  None Other Comments:  None  ROS History   Blood pressure (!) 133/91, pulse 86, temperature 98.1 F (36.7 C), temperature source Oral, resp. rate 16, height 5\' 3"  (1.6 m), weight 164 lb (74.4 kg), SpO2 100 %, unknown if currently breastfeeding. Exam Physical Exam  Prenatal labs: ABO, Rh:   Antibody:   Rubella:   RPR:    HBsAg:    HIV:    GBS:     Assessment/Plan: Twins Di/Di Vtx/Br at 32  4/7 Discordant growth.  For dopplers, MFM consult and EFM Gestational HTN - plan serial BPs, 24 h urine, serial labs Plan BMZ series C/S for delivery due to presentation   Tiffany George C 10/18/2017, 4:09 PM

## 2017-10-19 LAB — PROTEIN, URINE, 24 HOUR
COLLECTION INTERVAL-UPROT: 24 h
Protein, 24H Urine: 1241 mg/d — ABNORMAL HIGH (ref 50–100)
Protein, Urine: 146 mg/dL
URINE TOTAL VOLUME-UPROT: 850 mL

## 2017-10-19 LAB — CREATININE CLEARANCE, URINE, 24 HOUR
CREAT CLEAR: 168 mL/min — AB (ref 75–115)
Collection Interval-CRCL: 24 hours
Creatinine, 24H Ur: 1351 mg/d (ref 600–1800)
Creatinine, Urine: 158.94 mg/dL
URINE TOTAL VOLUME-CRCL: 850 mL

## 2017-10-19 NOTE — Discharge Summary (Signed)
Physician Discharge Summary  Patient ID: Tiffany ReadingKelli L Hymon MRN: [id number] DOB/AGE: 01/28/1981 36 y.o.  Admit date: 10/18/2017 Discharge date: 10/19/2017  Admission Diagnoses:Gestational HTN, Twins with IUGR twin B and twin discordancy  Discharge Diagnoses: Same Active Problems:   Preeclampsia   Discharged Condition: good  Hospital Course: Pt admitted and had MFM consultation and dopplers.  Serial BPs, labs, 24 h urine for gest HTN consistent with Gest HTN without severe features and pt discharged home with out pt  Management of this.  She received BMZ series  Consults: MFM  Significant Diagnostic Studies: normal labs  Treatments: EFM, BMZ, US  Discharge Exam: Blood pressure (!) 142/83, pulse 79, temperature 98 F (36.7 C), temperature source Oral, resp. rate 18, height 5\' 3"  (1.6 m), weight 164 lb (74.4 kg), last menstrual period 03/04/2017, SpO2 99 %, not currently breastfeeding. General appearance: alert, cooperative, appears stated age and no distress Abd gravid, nt  DTRs 1/4  Pt without complaints No PIH sxs   12/14 0700 12/15 0659 12/15 0700 12/15 1125  Most Recent     Temp (F)    97.9-98.7 98  98 (36.7)  12/15 0700  Pulse Rate    80-93 79  79  12/15 0700  Resp    16-20   18  12/15 0605  BP    124/70-142/89 142/83  142/83  12/15 0700  SpO2 (%)    100 99  99  12/15 0700  Weight (lb)  164   164 lb (74.4 kg)  12/15 0605   FHR cat 1 for A and B Ctxs irriitabiltiy only  DTRs 1/4 Trace edema  Imp/Plan Gest HTN - mild.  Complete 24 h urine.  Labs normal (except anemia) S/P BMZ Will cont with increased rest and d/c home with fu in 2 days with PIH warnings Twin discordancy - normal EFM.  FU with NST and dopplers in office per MFM DL   Disposition: [id number]-Home or Self Care  Discharge Instructions    Diet general   Complete by:  As directed    Sexual Activity Restrictions   Complete by:  As directed    Nothing in the vagina for 6 weeks     Allergies as of  10/19/2017      Reactions   Vicodin [hydrocodone-acetaminophen] Nausea And Vomiting      Medication List    TAKE these medications   acetaminophen 500 MG tablet Commonly known as:  TYLENOL Take 1,000 mg by mouth every 6 (six) hours as needed for moderate pain.   prenatal multivitamin Tabs tablet Take 1 tablet by mouth daily at 12 noon.        Signed: Tyee Vandevoorde C 10/19/2017, 11:31 AM

## 2017-10-19 NOTE — Progress Notes (Signed)
Patient ID: Tiffany George, female   DOB: 1981/10/21, 36 y.o.   MRN: [id number] Pt without complaints No PIH sxs   12/14 0700 12/15 0659 12/15 0700 12/15 1125  Most Recent     Temp (F)    97.9-98.7 98  98 (36.7)  12/15 0700  Pulse Rate    80-93 79  79  12/15 0700  Resp    16-20   18  12/15 0605  BP    124/70-142/89 142/83  142/83  12/15 0700  SpO2 (%)    100 99  99  12/15 0700  Weight (lb)  164   164 lb (74.4 kg)  12/15 0605   FHR cat 1 for A and B Ctxs irriitabiltiy only  DTRs 1/4 Trace edema  Imp/Plan Gest HTN - mild.  Complete 24 h urine.  Labs normal (except anemia) S/P BMZ Will cont with increased rest and d/c home with fu in 2 days with PIH warnings Twin discordancy - normal EFM.  FU with NST and dopplers in office per MFM DL

## 2017-10-19 NOTE — Progress Notes (Signed)
Discharge instructions given to patient and she verbalized understanding of all instructions given. Written copy of AVS given to patient. 

## 2017-10-21 ENCOUNTER — Ambulatory Visit (INDEPENDENT_AMBULATORY_CARE_PROVIDER_SITE_OTHER): Payer: BLUE CROSS/BLUE SHIELD | Admitting: *Deleted

## 2017-10-21 VITALS — BP 136/85 | HR 93

## 2017-10-21 DIAGNOSIS — O30043 Twin pregnancy, dichorionic/diamniotic, third trimester: Secondary | ICD-10-CM

## 2017-10-21 DIAGNOSIS — O133 Gestational [pregnancy-induced] hypertension without significant proteinuria, third trimester: Secondary | ICD-10-CM | POA: Diagnosis not present

## 2017-10-21 DIAGNOSIS — O30003 Twin pregnancy, unspecified number of placenta and unspecified number of amniotic sacs, third trimester: Secondary | ICD-10-CM

## 2017-10-21 DIAGNOSIS — O365932 Maternal care for other known or suspected poor fetal growth, third trimester, fetus 2: Secondary | ICD-10-CM | POA: Diagnosis not present

## 2017-10-21 NOTE — Progress Notes (Signed)
Copy of report and NST tracing sent to Dr. Grewal w/pt today.  

## 2017-10-23 ENCOUNTER — Other Ambulatory Visit: Payer: Self-pay

## 2017-10-23 ENCOUNTER — Inpatient Hospital Stay (HOSPITAL_COMMUNITY)
Admission: AD | Admit: 2017-10-23 | Discharge: 2017-10-23 | Disposition: A | Discharge status: Home / Self Care | Payer: BLUE CROSS/BLUE SHIELD | Source: Ambulatory Visit | Attending: Obstetrics and Gynecology | Admitting: Obstetrics and Gynecology

## 2017-10-23 DIAGNOSIS — Z3A33 33 weeks gestation of pregnancy: Secondary | ICD-10-CM | POA: Insufficient documentation

## 2017-10-23 DIAGNOSIS — O365932 Maternal care for other known or suspected poor fetal growth, third trimester, fetus 2: Secondary | ICD-10-CM | POA: Insufficient documentation

## 2017-10-23 DIAGNOSIS — O30043 Twin pregnancy, dichorionic/diamniotic, third trimester: Secondary | ICD-10-CM

## 2017-10-23 DIAGNOSIS — O133 Gestational [pregnancy-induced] hypertension without significant proteinuria, third trimester: Secondary | ICD-10-CM | POA: Diagnosis not present

## 2017-10-23 LAB — PROTEIN / CREATININE RATIO, URINE
Creatinine, Urine: 153 mg/dL
Total Protein, Urine: 511 mg/dL

## 2017-10-23 LAB — CBC WITH DIFFERENTIAL/PLATELET
BAND NEUTROPHILS: 0 %
BASOS PCT: 0 %
Basophils Absolute: 0 10*3/uL (ref 0.0–0.1)
Blasts: 0 %
EOS ABS: 0.2 10*3/uL (ref 0.0–0.7)
EOS PCT: 2 %
HCT: 30.7 % — ABNORMAL LOW (ref 36.0–46.0)
Hemoglobin: 9.7 g/dL — ABNORMAL LOW (ref 12.0–15.0)
LYMPHS ABS: 2.2 10*3/uL (ref 0.7–4.0)
LYMPHS PCT: 19 %
MCH: 26.8 pg (ref 26.0–34.0)
MCHC: 31.6 g/dL (ref 30.0–36.0)
MCV: 84.8 fL (ref 78.0–100.0)
MONO ABS: 0.1 10*3/uL (ref 0.1–1.0)
MYELOCYTES: 0 %
Metamyelocytes Relative: 0 %
Monocytes Relative: 1 %
NEUTROS PCT: 78 %
NRBC: 3 /100{WBCs} — AB
Neutro Abs: 8.9 10*3/uL — ABNORMAL HIGH (ref 1.7–7.7)
OTHER: 0 %
Platelets: 193 10*3/uL (ref 150–400)
Promyelocytes Absolute: 0 %
RBC: 3.62 MIL/uL — ABNORMAL LOW (ref 3.87–5.11)
RDW: 14.9 % (ref 11.5–15.5)
WBC: 11.4 10*3/uL — ABNORMAL HIGH (ref 4.0–10.5)

## 2017-10-23 LAB — COMPREHENSIVE METABOLIC PANEL
ALBUMIN: 2.6 g/dL — AB (ref 3.5–5.0)
ALT: 24 U/L (ref 14–54)
ANION GAP: 9 (ref 5–15)
AST: 28 U/L (ref 15–41)
Alkaline Phosphatase: 142 U/L — ABNORMAL HIGH (ref 38–126)
BUN: 11 mg/dL (ref 6–20)
CHLORIDE: 108 mmol/L (ref 101–111)
CO2: 19 mmol/L — AB (ref 22–32)
Calcium: 8.1 mg/dL — ABNORMAL LOW (ref 8.9–10.3)
Creatinine, Ser: 0.64 mg/dL (ref 0.44–1.00)
GFR calc Af Amer: >60 mL/min (ref >60)
GFR calc non Af Amer: >60 mL/min (ref >60)
GLUCOSE: 78 mg/dL (ref 65–99)
POTASSIUM: 3.7 mmol/L (ref 3.5–5.1)
SODIUM: 136 mmol/L (ref 135–145)
Total Bilirubin: 0.3 mg/dL (ref 0.3–1.2)
Total Protein: 5.5 g/dL — ABNORMAL LOW (ref 6.5–8.1)

## 2017-10-23 LAB — URINALYSIS, ROUTINE W REFLEX MICROSCOPIC
BILIRUBIN URINE: NEGATIVE
Glucose, UA: NEGATIVE mg/dL
HGB URINE DIPSTICK: NEGATIVE
Ketones, ur: NEGATIVE mg/dL
LEUKOCYTES UA: NEGATIVE
NITRITE: NEGATIVE
PH: 6 (ref 5.0–8.0)
Protein, ur: >=300 mg/dL — AB
SPECIFIC GRAVITY, URINE: 1.019 (ref 1.005–1.030)

## 2017-10-23 MED ORDER — LABETALOL HCL 100 MG PO TABS
100.0000 mg | ORAL_TABLET | Freq: Once | ORAL | Status: AC
  Administered 2017-10-23: 100 mg via ORAL
  Filled 2017-10-23: qty 1

## 2017-10-23 MED ORDER — LABETALOL HCL 100 MG PO TABS: 100.0000 mg | ORAL_TABLET | Freq: Once | ORAL | 0 refills | Status: AC

## 2017-10-23 NOTE — MAU Note (Signed)
BP up since last wk, has stayed elevated over the weekend.  Has protein in her urine today. Was admitted last wk, received Betamethasone. Denies HA, visual changes, increased swelling or epigastric pain

## 2017-10-23 NOTE — Progress Notes (Signed)
36 year old G 3 P 1 at 233 w 2 days with Di/Di Twin Gestation evaluated in office today with BP 155/98 and +3 Proteinuria.  She has no PIH Symptoms Pregnancy complicated by : Twin pregnancy  IUGR of Baby B and slightly elevated Dopplers of Baby B. Was in [facility] over the weekend for steroids and monitoring and discharged home on bedrest.  BP (!) 153/92   Pulse 82   Temp 98.7 F (37.1 C) (Oral)   Resp 16   Wt 76.7 kg (169 lb)   LMP 03/04/2017   SpO2 98%   BMI 29.94 kg/m  FHR of A and B is Category 1 General alert and oriented Lung CTAB Car RRR Abdomen gravid No significant edema  Results for orders placed or performed during the hospital encounter of 10/23/17 (from the past 24 hour(s))  Protein / creatinine ratio, urine     Status: None (Preliminary result)   Collection Time: 10/23/17  5:20 PM  Result Value Ref Range   Creatinine, Urine 153.00 mg/dL   Total Protein, Urine PENDING mg/dL   Protein Creatinine Ratio        0.00 - 0.15 mg/mg[Cre]  Urinalysis, Routine w reflex microscopic     Status: Abnormal   Collection Time: 10/23/17  5:20 PM  Result Value Ref Range   Color, Urine YELLOW YELLOW   APPearance HAZY (A) CLEAR   Specific Gravity, Urine 1.019 1.005 - 1.030   pH 6.0 5.0 - 8.0   Glucose, UA NEGATIVE NEGATIVE mg/dL   Hgb urine dipstick NEGATIVE NEGATIVE   Bilirubin Urine NEGATIVE NEGATIVE   Ketones, ur NEGATIVE NEGATIVE mg/dL   Protein, ur >=161>=300 (A) NEGATIVE mg/dL   Nitrite NEGATIVE NEGATIVE   Leukocytes, UA NEGATIVE NEGATIVE   RBC / HPF 0-5 0 - 5 RBC/hpf   WBC, UA 0-5 0 - 5 WBC/hpf   Bacteria, UA FEW (A) NONE SEEN   Squamous Epithelial / LPF 6-30 (A) NONE SEEN   Mucus PRESENT    Hyaline Casts, UA PRESENT   CBC with Differential/Platelet     Status: Abnormal   Collection Time: 10/23/17  5:29 PM  Result Value Ref Range   WBC 11.4 (H) 4.0 - 10.5 K/uL   RBC 3.62 (L) 3.87 - 5.11 MIL/uL   Hemoglobin 9.7 (L) 12.0 - 15.0 g/dL   HCT 09.630.7 (L) 04.536.0 - 40.946.0 %    MCV 84.8 78.0 - 100.0 fL   MCH 26.8 26.0 - 34.0 pg   MCHC 31.6 30.0 - 36.0 g/dL   RDW 81.114.9 91.411.5 - 78.215.5 %   Platelets 193 150 - 400 K/uL   Neutrophils Relative % 78 %   Lymphocytes Relative 19 %   Monocytes Relative 1 %   Eosinophils Relative 2 %   Basophils Relative 0 %   Band Neutrophils 0 %   Metamyelocytes Relative 0 %   Myelocytes 0 %   Promyelocytes Absolute 0 %   Blasts 0 %   nRBC 3 (H) 0 /100 WBC   Other 0 %   Neutro Abs 8.9 (H) 1.7 - 7.7 K/uL   Lymphs Abs 2.2 0.7 - 4.0 K/uL   Monocytes Absolute 0.1 0.1 - 1.0 K/uL   Eosinophils Absolute 0.2 0.0 - 0.7 K/uL   Basophils Absolute 0.0 0.0 - 0.1 K/uL  Comprehensive metabolic panel     Status: Abnormal   Collection Time: 10/23/17  5:29 PM  Result Value Ref Range   Sodium 136 135 - 145 mmol/L  Potassium 3.7 3.5 - 5.1 mmol/L   Chloride 108 101 - 111 mmol/L   CO2 19 (L) 22 - 32 mmol/L   Glucose, Bld 78 65 - 99 mg/dL   BUN 11 6 - 20 mg/dL   Creatinine, Ser 1.610.64 0.44 - 1.00 mg/dL   Calcium 8.1 (L) 8.9 - 10.3 mg/dL   Total Protein 5.5 (L) 6.5 - 8.1 g/dL   Albumin 2.6 (L) 3.5 - 5.0 g/dL   AST 28 15 - 41 U/L   ALT 24 14 - 54 U/L   Alkaline Phosphatase 142 (H) 38 - 126 U/L   Total Bilirubin 0.3 0.3 - 1.2 mg/dL   GFR calc non Af Amer >60 >60 mL/min   GFR calc Af Amer >60 >60 mL/min   Anion gap 9 5 - 15   IMPRESSION: TWIN IUP at 33 w 2 day  IUGR of B Gestational Hypertension  PLAN: Labs are stable and BP improved in triage Will discharge home on bedrest and po labetalol bid Will arrange MFM consult tomorrow for consultation about timing of delivery and repeat Dopplers on Baby B I had a discussion with patient and her husband in regards to timing of delivery  - optimal after 34 weeks  Will await MFM consultation

## 2017-10-24 ENCOUNTER — Ambulatory Visit (HOSPITAL_COMMUNITY)
Admission: RE | Admit: 2017-10-24 | Discharge: 2017-10-24 | Disposition: A | Discharge status: Home / Self Care | Payer: BLUE CROSS/BLUE SHIELD | Source: Ambulatory Visit | Attending: Obstetrics and Gynecology | Admitting: Obstetrics and Gynecology

## 2017-10-24 ENCOUNTER — Other Ambulatory Visit (HOSPITAL_COMMUNITY): Payer: Self-pay | Admitting: Obstetrics and Gynecology

## 2017-10-24 ENCOUNTER — Other Ambulatory Visit (HOSPITAL_COMMUNITY): Payer: Self-pay | Admitting: *Deleted

## 2017-10-24 ENCOUNTER — Encounter (HOSPITAL_COMMUNITY): Payer: Self-pay

## 2017-10-24 DIAGNOSIS — O365931 Maternal care for other known or suspected poor fetal growth, third trimester, fetus 1: Secondary | ICD-10-CM

## 2017-10-24 DIAGNOSIS — O365930 Maternal care for other known or suspected poor fetal growth, third trimester, not applicable or unspecified: Secondary | ICD-10-CM | POA: Diagnosis not present

## 2017-10-24 DIAGNOSIS — O30003 Twin pregnancy, unspecified number of placenta and unspecified number of amniotic sacs, third trimester: Secondary | ICD-10-CM

## 2017-10-24 DIAGNOSIS — O30043 Twin pregnancy, dichorionic/diamniotic, third trimester: Secondary | ICD-10-CM | POA: Diagnosis present

## 2017-10-24 DIAGNOSIS — O1493 Unspecified pre-eclampsia, third trimester: Secondary | ICD-10-CM

## 2017-10-24 DIAGNOSIS — O1403 Mild to moderate pre-eclampsia, third trimester: Secondary | ICD-10-CM | POA: Diagnosis not present

## 2017-10-24 DIAGNOSIS — Z3A33 33 weeks gestation of pregnancy: Secondary | ICD-10-CM | POA: Insufficient documentation

## 2017-10-24 DIAGNOSIS — O30049 Twin pregnancy, dichorionic/diamniotic, unspecified trimester: Secondary | ICD-10-CM

## 2017-10-24 NOTE — Consult Note (Signed)
MATERNAL FETAL MEDICINE CONSULT  Patient Name: Tiffany George Medical Record Number:  [id number] Date of Birth: [birth year] Requesting Physician Name:  Marcelle OverlieGrewal, Michelle, MD Date of Service: 10/24/2017  Chief Complaint Dichorionic diamniotic twin pregnancy with fetal growth restriction of Twin B.  History of Present Illness Tiffany George  is a 36 y.o. G3P1011,at 2632w3d with an EDD of 12/09/2017 who was discovered to have fetal growth restriction of Twin B last week.  She has also been confirmed to have preeclampsia without severe features.  She is here today for repeat BPPs and Dopplers of Twin B.  She denies headache, visual changes, RUQ pain, vaginal bleeding, loss of fluid, or contractions.  Review of Systems Pertinent items are noted in HPI.  Patient History OB History  Gravida Para Term Preterm AB Living  3 1 1  0 1 1  SAB TAB Ectopic Multiple Live Births  1 0 0 0 1    # Outcome Date GA Lbr Len/2nd Weight Sex Delivery Anes PTL Lv  3 Current           2 Term 03/01/15 4916w1d 03:03 / 02:51 6 lb 11.2 oz (3.039 kg) M Vag-Spont EPI  LIV  1 SAB               Past Medical History:  Diagnosis Date  . Complication of anesthesia    stated was hard to wake up p anesthesia 2008  . Hx of breast augmentation 2008  . Infertility, female   . Melasma 2012    Past Surgical History:  Procedure Laterality Date  . BREAST SURGERY     Augmentation  . DILATION AND EVACUATION N/A 02/11/2014   Procedure: DILATATION AND EVACUATION;  Surgeon: Annamaria BootsMary Suzanne Miller, MD;  Location: [facility];  Service: Gynecology;  Laterality: N/A;    Social History   Socioeconomic History  . Marital status: Married    Spouse name: Not on file  . Number of children: Not on file  . Years of education: Not on file  . Highest education level: Not on file  Social Needs  . Financial resource strain: Not on file  . Food insecurity - worry: Not on file  . Food insecurity - inability: Not on file  . Transportation needs -  medical: Not on file  . Transportation needs - non-medical: Not on file  Occupational History  . Not on file  Tobacco Use  . Smoking status: Never Smoker  . Smokeless tobacco: Never Used  Substance and Sexual Activity  . Alcohol use: No    Alcohol/week: 1.8 oz    Types: 3 Glasses of wine per week  . Drug use: No  . Sexual activity: Yes    Partners: Male    Birth control/protection: None  Other Topics Concern  . Not on file  Social History Narrative  . Not on file    Family History  Problem Relation Age of Onset  . Cancer - Lung Paternal Grandfather    In addition, the patient has no family history of mental retardation, birth defects, or genetic diseases.  Physical Examination There were no vitals filed for this visit. General appearance - alert, well appearing, and in no distress Mental status - alert, oriented to person, place, and time  Assessment and Recommendations 2.  Fetal growth restriction of Twin B.  Twin B had an 8/8 BPP today.  Its umbilical artery Dopplers showed an increased S/D ratio, but no absent or reversed flow.  In this situation commonly  used guidelines would indicated that continuing pregnancy until 37 weeks is appropriate with twice weekly fetal assessments, which is the management plan I would favor.  However, admittedly Ms. Nicolosi pregnancy has the additional risk factor of a twin pregnancy and preeclampsia without severe features.  So it is appropriate to have a very low threshold for delivery after Ms. Procter reaches 34 weeks, which is in just a few days, or consider scheduling delivery any time after 34 weeks if Ms. Polak and her primary OB prefer.  I spent 20 minutes with Ms. Michl today of which 50% was face-to-face counseling.  Thank you for referring [facility] Helbig [facility].  Please do not hesitate to contact us with questions.   Rema FendtNITSCHE,Raylynne Cubbage, MD

## 2017-10-25 ENCOUNTER — Other Ambulatory Visit (HOSPITAL_COMMUNITY): Payer: Self-pay

## 2017-10-25 ENCOUNTER — Encounter (HOSPITAL_COMMUNITY): Payer: Self-pay

## 2017-10-28 ENCOUNTER — Ambulatory Visit (HOSPITAL_COMMUNITY)
Admission: RE | Admit: 2017-10-28 | Discharge: 2017-10-28 | Disposition: A | Discharge status: Home / Self Care | Payer: BLUE CROSS/BLUE SHIELD | Source: Ambulatory Visit | Attending: Obstetrics and Gynecology | Admitting: Obstetrics and Gynecology

## 2017-10-28 ENCOUNTER — Encounter (HOSPITAL_COMMUNITY): Payer: Self-pay

## 2017-10-28 DIAGNOSIS — Z3A34 34 weeks gestation of pregnancy: Secondary | ICD-10-CM | POA: Insufficient documentation

## 2017-10-28 DIAGNOSIS — O365930 Maternal care for other known or suspected poor fetal growth, third trimester, not applicable or unspecified: Secondary | ICD-10-CM | POA: Diagnosis not present

## 2017-10-28 DIAGNOSIS — O1403 Mild to moderate pre-eclampsia, third trimester: Secondary | ICD-10-CM | POA: Insufficient documentation

## 2017-10-28 DIAGNOSIS — O30043 Twin pregnancy, dichorionic/diamniotic, third trimester: Secondary | ICD-10-CM | POA: Insufficient documentation

## 2017-10-28 DIAGNOSIS — O30049 Twin pregnancy, dichorionic/diamniotic, unspecified trimester: Secondary | ICD-10-CM

## 2017-10-31 ENCOUNTER — Encounter (HOSPITAL_COMMUNITY): Payer: Self-pay | Admitting: *Deleted

## 2017-10-31 ENCOUNTER — Other Ambulatory Visit (HOSPITAL_COMMUNITY): Payer: Self-pay | Admitting: Obstetrics and Gynecology

## 2017-10-31 ENCOUNTER — Inpatient Hospital Stay (HOSPITAL_COMMUNITY)
Admission: AD | Admit: 2017-10-31 | Discharge: 2017-10-31 | Payer: BLUE CROSS/BLUE SHIELD | Source: Ambulatory Visit | Attending: Obstetrics and Gynecology | Admitting: Obstetrics and Gynecology

## 2017-10-31 ENCOUNTER — Encounter (HOSPITAL_COMMUNITY): Payer: Self-pay

## 2017-10-31 ENCOUNTER — Ambulatory Visit (HOSPITAL_COMMUNITY)
Admission: RE | Admit: 2017-10-31 | Discharge: 2017-10-31 | Disposition: A | Discharge status: Home / Self Care | Payer: BLUE CROSS/BLUE SHIELD | Source: Ambulatory Visit | Attending: Obstetrics and Gynecology | Admitting: Obstetrics and Gynecology

## 2017-10-31 ENCOUNTER — Other Ambulatory Visit (HOSPITAL_COMMUNITY): Payer: Self-pay | Admitting: Maternal and Fetal Medicine

## 2017-10-31 DIAGNOSIS — O30043 Twin pregnancy, dichorionic/diamniotic, third trimester: Secondary | ICD-10-CM

## 2017-10-31 DIAGNOSIS — O365930 Maternal care for other known or suspected poor fetal growth, third trimester, not applicable or unspecified: Secondary | ICD-10-CM | POA: Diagnosis not present

## 2017-10-31 DIAGNOSIS — Z79899 Other long term (current) drug therapy: Secondary | ICD-10-CM | POA: Diagnosis not present

## 2017-10-31 DIAGNOSIS — O1403 Mild to moderate pre-eclampsia, third trimester: Secondary | ICD-10-CM | POA: Insufficient documentation

## 2017-10-31 DIAGNOSIS — Z3A34 34 weeks gestation of pregnancy: Secondary | ICD-10-CM

## 2017-10-31 DIAGNOSIS — O365932 Maternal care for other known or suspected poor fetal growth, third trimester, fetus 2: Secondary | ICD-10-CM | POA: Insufficient documentation

## 2017-10-31 DIAGNOSIS — O1493 Unspecified pre-eclampsia, third trimester: Secondary | ICD-10-CM | POA: Diagnosis not present

## 2017-10-31 DIAGNOSIS — Z9889 Other specified postprocedural states: Secondary | ICD-10-CM | POA: Diagnosis not present

## 2017-10-31 DIAGNOSIS — O09299 Supervision of pregnancy with other poor reproductive or obstetric history, unspecified trimester: Secondary | ICD-10-CM

## 2017-10-31 DIAGNOSIS — O163 Unspecified maternal hypertension, third trimester: Secondary | ICD-10-CM | POA: Insufficient documentation

## 2017-10-31 DIAGNOSIS — O30049 Twin pregnancy, dichorionic/diamniotic, unspecified trimester: Secondary | ICD-10-CM

## 2017-10-31 DIAGNOSIS — Z801 Family history of malignant neoplasm of trachea, bronchus and lung: Secondary | ICD-10-CM | POA: Diagnosis not present

## 2017-10-31 DIAGNOSIS — Z885 Allergy status to narcotic agent status: Secondary | ICD-10-CM | POA: Insufficient documentation

## 2017-10-31 DIAGNOSIS — O30042 Twin pregnancy, dichorionic/diamniotic, second trimester: Secondary | ICD-10-CM | POA: Diagnosis not present

## 2017-10-31 LAB — PROTEIN / CREATININE RATIO, URINE
Creatinine, Urine: 143 mg/dL
Protein Creatinine Ratio: 4.96 mg/mg{Cre} — ABNORMAL HIGH (ref 0.00–0.15)
Total Protein, Urine: 709 mg/dL

## 2017-10-31 LAB — URINALYSIS, ROUTINE W REFLEX MICROSCOPIC
BILIRUBIN URINE: NEGATIVE
GLUCOSE, UA: NEGATIVE mg/dL
HGB URINE DIPSTICK: NEGATIVE
KETONES UR: NEGATIVE mg/dL
NITRITE: NEGATIVE
Specific Gravity, Urine: 1.022 (ref 1.005–1.030)
pH: 7 (ref 5.0–8.0)

## 2017-10-31 LAB — CBC
HEMATOCRIT: 32.9 % — AB (ref 36.0–46.0)
Hemoglobin: 10.5 g/dL — ABNORMAL LOW (ref 12.0–15.0)
MCH: 26.4 pg (ref 26.0–34.0)
MCHC: 31.9 g/dL (ref 30.0–36.0)
MCV: 82.7 fL (ref 78.0–100.0)
Platelets: 199 10*3/uL (ref 150–400)
RBC: 3.98 MIL/uL (ref 3.87–5.11)
RDW: 15.6 % — ABNORMAL HIGH (ref 11.5–15.5)
WBC: 8.7 10*3/uL (ref 4.0–10.5)

## 2017-10-31 LAB — COMPREHENSIVE METABOLIC PANEL
ALT: 12 U/L — ABNORMAL LOW (ref 14–54)
ANION GAP: 12 (ref 5–15)
AST: 19 U/L (ref 15–41)
Albumin: 2.6 g/dL — ABNORMAL LOW (ref 3.5–5.0)
Alkaline Phosphatase: 180 U/L — ABNORMAL HIGH (ref 38–126)
BILIRUBIN TOTAL: 0.5 mg/dL (ref 0.3–1.2)
BUN: 15 mg/dL (ref 6–20)
CHLORIDE: 102 mmol/L (ref 101–111)
CO2: 19 mmol/L — ABNORMAL LOW (ref 22–32)
Calcium: 9.2 mg/dL (ref 8.9–10.3)
Creatinine, Ser: 0.73 mg/dL (ref 0.44–1.00)
GFR calc Af Amer: >60 mL/min (ref >60)
Glucose, Bld: 80 mg/dL (ref 65–99)
POTASSIUM: 4.4 mmol/L (ref 3.5–5.1)
Sodium: 133 mmol/L — ABNORMAL LOW (ref 135–145)
TOTAL PROTEIN: 5.7 g/dL — AB (ref 6.5–8.1)

## 2017-10-31 MED ORDER — HYDRALAZINE HCL 20 MG/ML IJ SOLN: 10.0000 mg | Freq: Once | INTRAMUSCULAR | Status: DC | PRN

## 2017-10-31 MED ORDER — BETAMETHASONE SOD PHOS & ACET 6 (3-3) MG/ML IJ SUSP
12.0000 mg | Freq: Once | INTRAMUSCULAR | Status: DC
  Filled 2017-10-31: qty 2

## 2017-10-31 MED ORDER — LABETALOL HCL 5 MG/ML IV SOLN: 20.0000 mg | INTRAVENOUS | Status: DC | PRN

## 2017-10-31 MED ORDER — LACTATED RINGERS IV SOLN
INTRAVENOUS | Status: DC
  Administered 2017-10-31: 17:00:00 via INTRAVENOUS

## 2017-10-31 NOTE — MAU Provider Note (Addendum)
History     CSN: [id number]  Arrival date and time: 10/31/17 1505   First Provider Initiated Contact with Patient 10/31/17 1619      Chief Complaint  Patient presents with  . Hypertension   Tiffany George is a 36 y.o. G3P1011 at 6334w3d who presents today with elevated blood pressure. She states that she has been on labetalol 100mg  BID since 10/19/17. She denies hx of CHTN. She missed her dose on Christmas morning, and since then she has not been able to keep her blood pressure under 140/90. She has had it as high as 160/105 at home. Hx of one full-term vaginal delivery. She was induced due to elevated blood pressure with that pregnancy. That was in 2016. She is planning a c-section with this pregnancy. Last ate 12:30, has had sips of water since    Hypertension  This is a new problem. The current episode started 1 to 4 weeks ago. The problem is unchanged. The problem is uncontrolled. Associated symptoms include headaches ("very mild" headache since yesterday. Has not taken anything for it. ). Pertinent negatives include no blurred vision. Associated agents: pregnancy. Past treatments include beta blockers. The current treatment provides mild improvement. There are no compliance problems.    Past Medical History:  Diagnosis Date  . Complication of anesthesia    stated was hard to wake up p anesthesia 2008  . Hx of breast augmentation 2008  . Infertility, female   . Melasma 2012    Past Surgical History:  Procedure Laterality Date  . BREAST SURGERY     Augmentation  . DILATION AND EVACUATION N/A 02/11/2014   Procedure: DILATATION AND EVACUATION;  Surgeon: Annamaria BootsMary Suzanne Miller, MD;  Location: [facility];  Service: Gynecology;  Laterality: N/A;    Family History  Problem Relation Age of Onset  . Cancer - Lung Paternal Grandfather     Social History   Tobacco Use  . Smoking status: Never Smoker  . Smokeless tobacco: Never Used  Substance Use Topics  . Alcohol use: No   Alcohol/week: 1.8 oz    Types: 3 Glasses of wine per week  . Drug use: No    Allergies:  Allergies  Allergen Reactions  . Vicodin [Hydrocodone-Acetaminophen] Nausea And Vomiting    Medications Prior to Admission  Medication Sig Dispense Refill Last Dose  . acetaminophen (TYLENOL) 500 MG tablet Take 1,000 mg by mouth every 6 (six) hours as needed for moderate pain.   Past Month at Unknown time  . calcium carbonate (TUMS - DOSED IN MG ELEMENTAL CALCIUM) 500 MG chewable tablet Chew 1 tablet by mouth 2 (two) times daily as needed for indigestion or heartburn.   Past Week at Unknown time  . LABETALOL HCL PO Take 100 mg by mouth 2 (two) times daily.    10/31/2017 at 0930  . Prenatal Vit-Fe Fumarate-FA (PRENATAL MULTIVITAMIN) TABS tablet Take 1 tablet by mouth daily at 12 noon.   Past Week at Unknown time  . labetalol (NORMODYNE) 100 MG tablet Take 1 tablet (100 mg total) by mouth once for 1 dose. 1 tablet 0     Review of Systems  Eyes: Negative for blurred vision.  Neurological: Positive for headaches ("very mild" headache since yesterday. Has not taken anything for it. ).   Physical Exam   Blood pressure (!) 144/96, pulse (!) 106, temperature (!) 97.1 F (36.2 C), resp. rate 20, last menstrual period 03/04/2017.  Physical Exam  Nursing note and vitals reviewed. Constitutional: She  is oriented to person, place, and time. She appears well-developed and well-nourished. No distress.  HENT:  Head: Normocephalic.  Cardiovascular: Normal rate.  Respiratory: Effort normal.  GI: Soft. There is no tenderness. There is no rebound.  Neurological: She is alert and oriented to person, place, and time. She has normal reflexes. She exhibits normal muscle tone (no clonus ).  Skin: Skin is warm and dry.  Psychiatric: She has a normal mood and affect.   Results for orders placed or performed during the hospital encounter of 10/31/17 (from the past 24 hour(s))  Urinalysis, Routine w reflex  microscopic     Status: Abnormal   Collection Time: 10/31/17  3:25 PM  Result Value Ref Range   Color, Urine YELLOW YELLOW   APPearance HAZY (A) CLEAR   Specific Gravity, Urine 1.022 1.005 - 1.030   pH 7.0 5.0 - 8.0   Glucose, UA NEGATIVE NEGATIVE mg/dL   Hgb urine dipstick NEGATIVE NEGATIVE   Bilirubin Urine NEGATIVE NEGATIVE   Ketones, ur NEGATIVE NEGATIVE mg/dL   Protein, ur >=161 (A) NEGATIVE mg/dL   Nitrite NEGATIVE NEGATIVE   Leukocytes, UA TRACE (A) NEGATIVE   RBC / HPF 0-5 0 - 5 RBC/hpf   WBC, UA 0-5 0 - 5 WBC/hpf   Bacteria, UA RARE (A) NONE SEEN   Squamous Epithelial / LPF 6-30 (A) NONE SEEN   Mucus PRESENT   Comprehensive metabolic panel     Status: Abnormal   Collection Time: 10/31/17  3:49 PM  Result Value Ref Range   Sodium 133 (L) 135 - 145 mmol/L   Potassium 4.4 3.5 - 5.1 mmol/L   Chloride 102 101 - 111 mmol/L   CO2 19 (L) 22 - 32 mmol/L   Glucose, Bld 80 65 - 99 mg/dL   BUN 15 6 - 20 mg/dL   Creatinine, Ser 0.96 0.44 - 1.00 mg/dL   Calcium 9.2 8.9 - 04.5 mg/dL   Total Protein 5.7 (L) 6.5 - 8.1 g/dL   Albumin 2.6 (L) 3.5 - 5.0 g/dL   AST 19 15 - 41 U/L   ALT 12 (L) 14 - 54 U/L   Alkaline Phosphatase 180 (H) 38 - 126 U/L   Total Bilirubin 0.5 0.3 - 1.2 mg/dL   GFR calc non Af Amer >60 >60 mL/min   GFR calc Af Amer >60 >60 mL/min   Anion gap 12 5 - 15  CBC     Status: Abnormal   Collection Time: 10/31/17  3:49 PM  Result Value Ref Range   WBC 8.7 4.0 - 10.5 K/uL   RBC 3.98 3.87 - 5.11 MIL/uL   Hemoglobin 10.5 (L) 12.0 - 15.0 g/dL   HCT 40.9 (L) 81.1 - 91.4 %   MCV 82.7 78.0 - 100.0 fL   MCH 26.4 26.0 - 34.0 pg   MCHC 31.9 30.0 - 36.0 g/dL   RDW 78.2 (H) 95.6 - 21.3 %   Platelets 199 150 - 400 K/uL   FHT: A: 135, moderate with 15x15 accels, no decels B: 140, moderate with 15x15 accels, no decels Toco: no UCs   MAU Course  Procedures  MDM 1642: DW Dr. Elon Spanner, plan is for patient to be delivered, but she needs to be transported to [facility] because  the NICU is full at this time.  1715: Paged [facility] Physician connect.   Assessment and Plan  Pre-eclampsia Di/di twins with discordant growth Transfer to [facility] for delivery  Dr. Elon Spanner on her way to see the patient.  Will give labetalol PRN and start mag if she needs labetalol.   Thressa ShellerHeather Hogan 10/31/2017, 4:22 PM  Called by MFM regarding this patient after dopplers were noted to be elevated. They recommended delivery. Patient has pre-eclampsia with severe features - PO medication likely masking higher elevations in BP. Twin B with IUGR and elevated dopplers. Our NICU is full and [facility].    Rosie FateE Jagdeep Ancheta MD

## 2017-10-31 NOTE — MAU Note (Addendum)
Pt sent from MD office for elevated BP, is on labetalol, last dose at 0930.  Has had a dull HA since yesterday, denies visual changes or epigastric pain.  Feels like she may be having occasional contractions, denies bleeding or LOF.

## 2017-10-31 NOTE — ED Notes (Signed)
Report called to L. Vickki MuffWeston, Charity fundraiserN.  Pt to MAU for evaluation.

## 2017-10-31 NOTE — MAU Note (Signed)
Report given to [facility], anticipate their arrival in one hour.

## 2017-11-06 ENCOUNTER — Other Ambulatory Visit: Payer: BLUE CROSS/BLUE SHIELD

## 2018-03-03 ENCOUNTER — Encounter (HOSPITAL_COMMUNITY): Payer: Self-pay

## 2021-10-09 ENCOUNTER — Other Ambulatory Visit: Payer: Self-pay | Admitting: Obstetrics and Gynecology

## 2021-10-09 DIAGNOSIS — N6001 Solitary cyst of right breast: Secondary | ICD-10-CM

## 2021-11-13 ENCOUNTER — Ambulatory Visit
Admission: RE | Admit: 2021-11-13 | Discharge: 2021-11-13 | Disposition: A | Discharge status: Home / Self Care | Payer: BLUE CROSS/BLUE SHIELD | Source: Ambulatory Visit | Attending: Obstetrics and Gynecology | Admitting: Obstetrics and Gynecology

## 2021-11-13 ENCOUNTER — Other Ambulatory Visit: Payer: Self-pay | Admitting: Obstetrics and Gynecology

## 2021-11-13 ENCOUNTER — Ambulatory Visit
Admission: RE | Admit: 2021-11-13 | Discharge: 2021-11-13 | Disposition: A | Discharge status: Home / Self Care | Payer: BC Managed Care – PPO | Source: Ambulatory Visit | Attending: Obstetrics and Gynecology | Admitting: Obstetrics and Gynecology

## 2021-11-13 DIAGNOSIS — N6001 Solitary cyst of right breast: Secondary | ICD-10-CM
